# Patient Record
Sex: Male | Born: 1960 | Hispanic: Yes | Marital: Married | Smoking: Former smoker
Health system: Southern US, Community
[De-identification: ages and names within clinical notes are randomized; demographics above are authoritative.]

## PROBLEM LIST (undated history)

## (undated) DIAGNOSIS — E119 Type 2 diabetes mellitus without complications: Secondary | ICD-10-CM

## (undated) HISTORY — DX: Type 2 diabetes mellitus without complications: E11.9

---

## 2001-04-02 ENCOUNTER — Ambulatory Visit (HOSPITAL_COMMUNITY): Admission: RE | Admit: 2001-04-02 | Discharge: 2001-04-02 | Payer: Self-pay | Admitting: Pediatrics

## 2001-04-02 ENCOUNTER — Encounter: Payer: Self-pay | Admitting: General Surgery

## 2008-07-01 DIAGNOSIS — E119 Type 2 diabetes mellitus without complications: Secondary | ICD-10-CM | POA: Insufficient documentation

## 2014-06-13 ENCOUNTER — Other Ambulatory Visit (HOSPITAL_COMMUNITY): Payer: Self-pay | Admitting: General Surgery

## 2014-06-13 ENCOUNTER — Ambulatory Visit (HOSPITAL_COMMUNITY)
Admission: RE | Admit: 2014-06-13 | Discharge: 2014-06-13 | Disposition: A | Discharge status: Home / Self Care | Payer: Self-pay | Source: Ambulatory Visit | Attending: General Surgery | Admitting: General Surgery

## 2014-06-13 DIAGNOSIS — M25521 Pain in right elbow: Secondary | ICD-10-CM | POA: Insufficient documentation

## 2015-12-14 IMAGING — CR DG ELBOW COMPLETE 3+V*R*
4 series · 4 of 4 positions shown · non-contrast
Comparison: None

CLINICAL DATA: Six day month history of right elbow pain without
recent injury, but there is a history of remote injury

EXAM:
RIGHT ELBOW - COMPLETE 3+ VIEW

[view not recorded (1 of 4)]
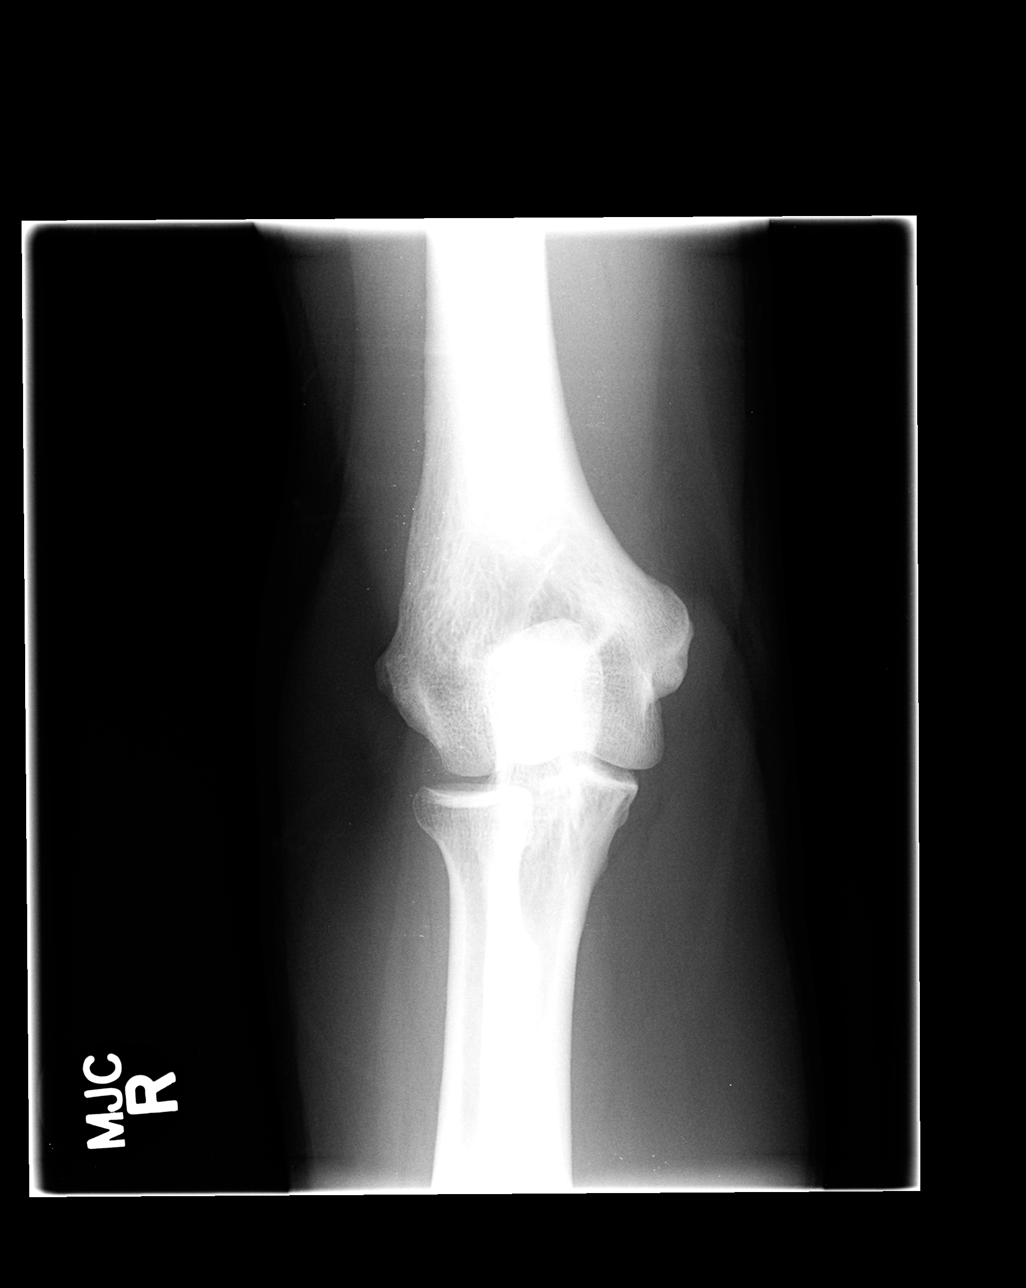

[view not recorded (2 of 4)]
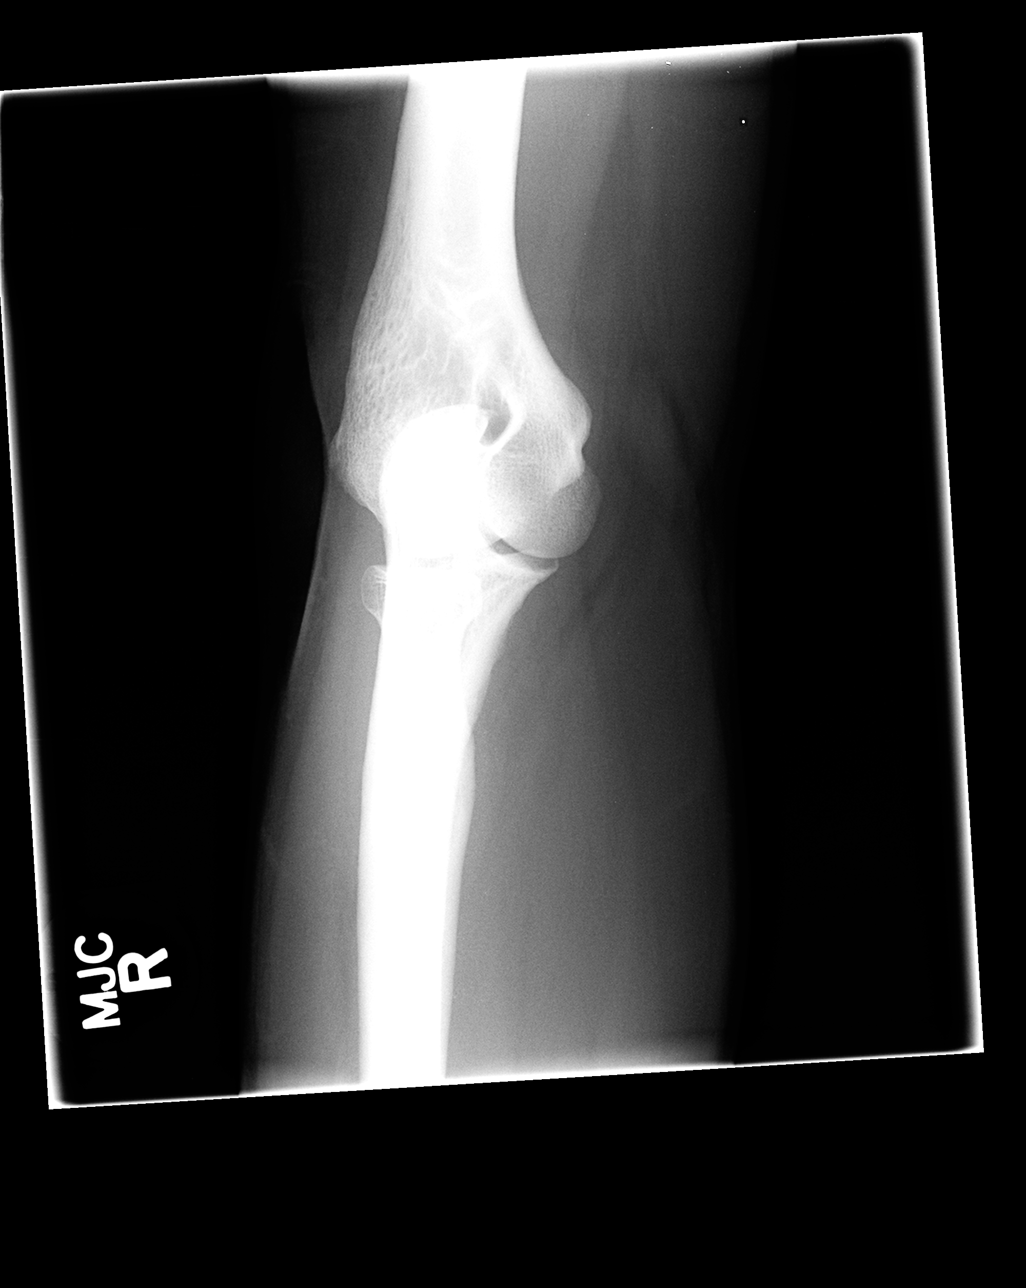

[view not recorded (3 of 4)]
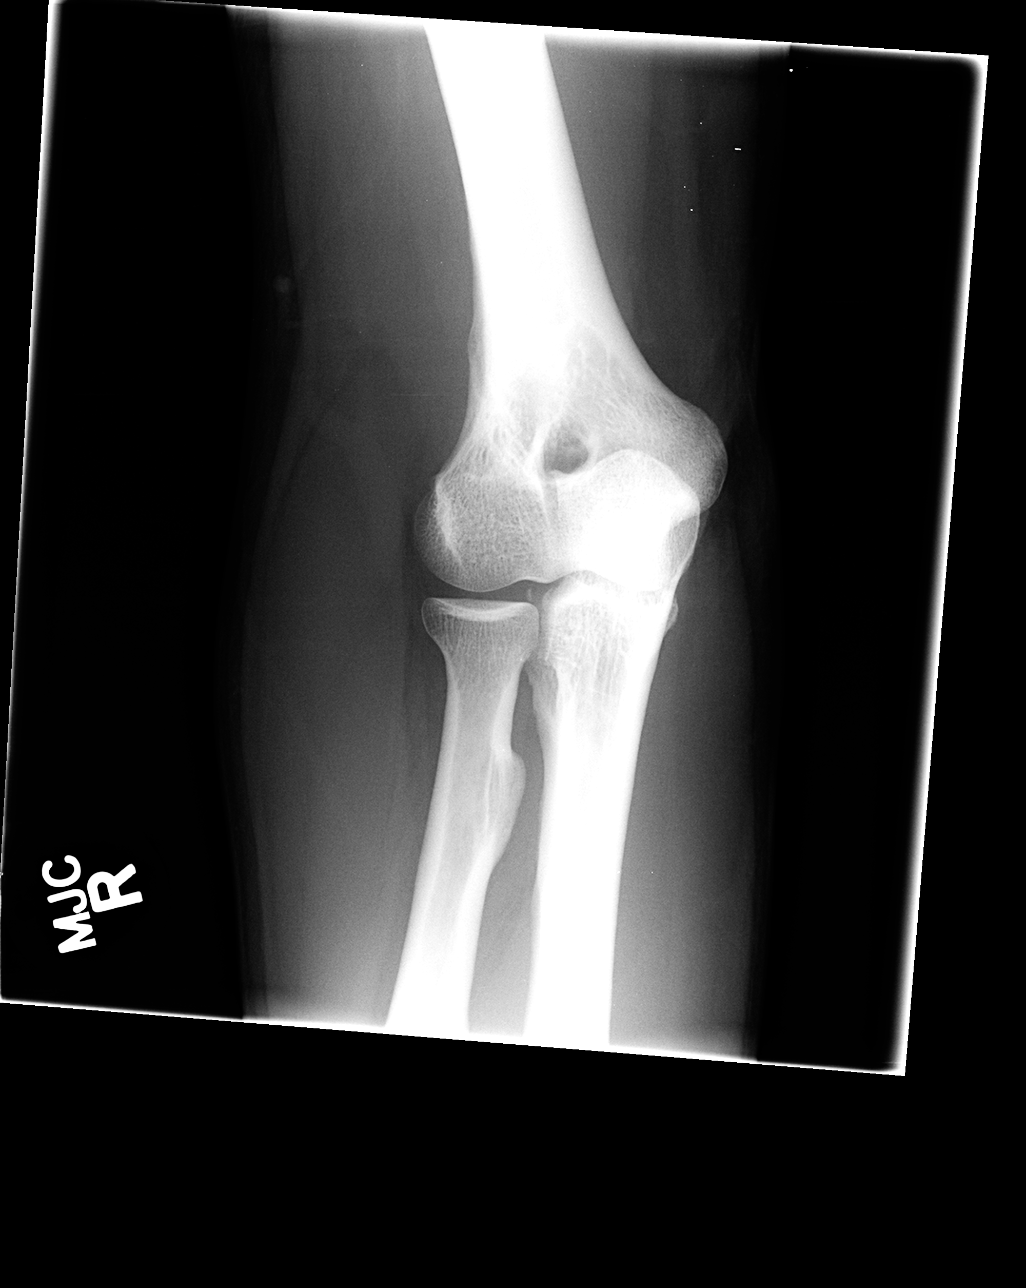

[view not recorded (4 of 4)]
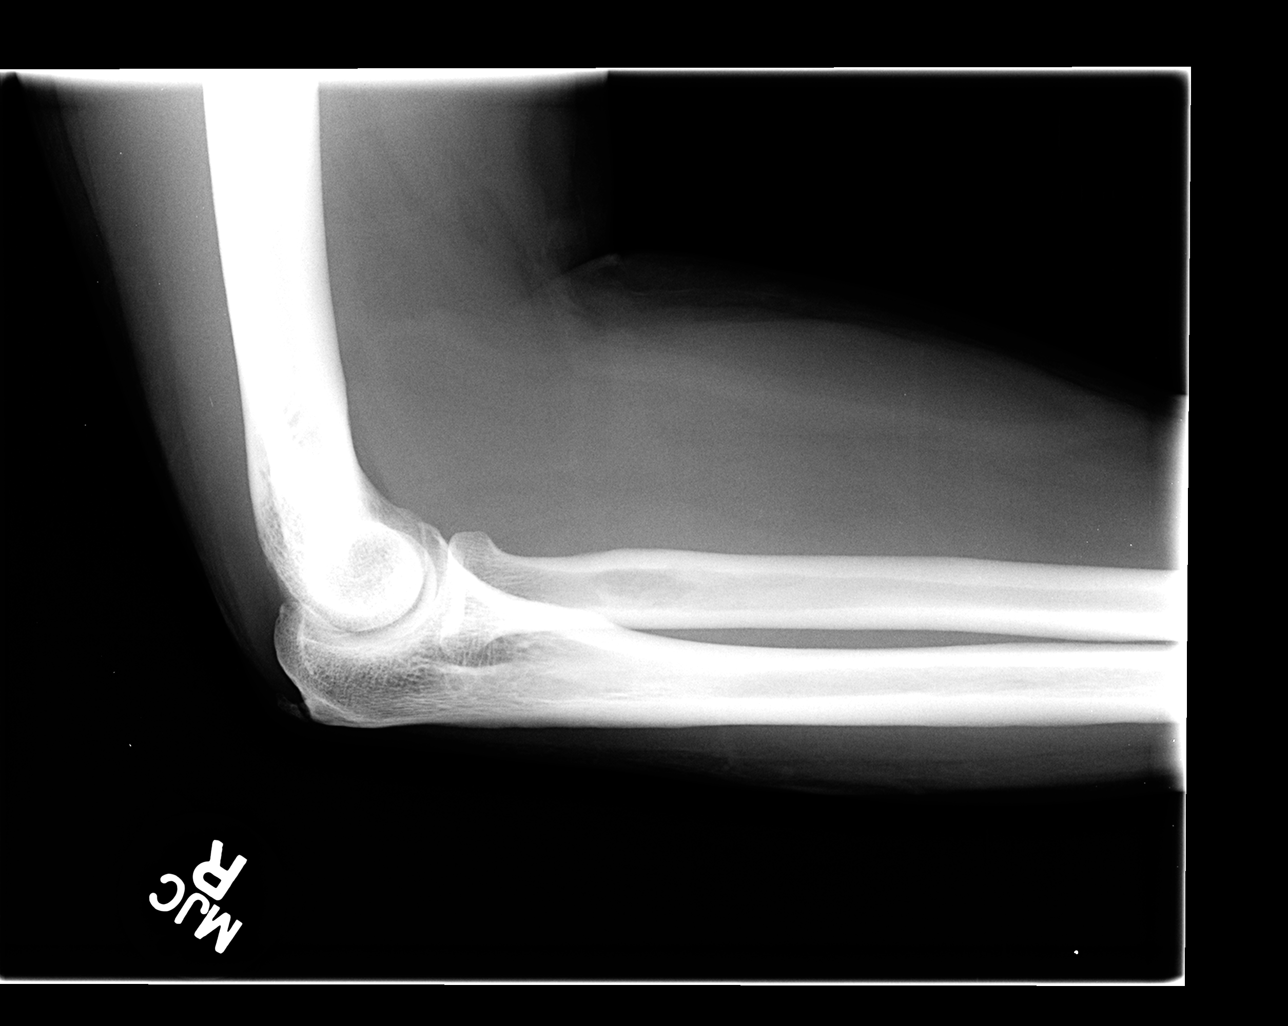

[4 of 4 positions shown; findings below may reference images not displayed]

FINDINGS: The bones of the elbow are adequately mineralized. There is no acute
or healing or old fracture. There is an olecranon spur. There is no
joint effusion.
IMPRESSION: There is no acute or significant chronic bony abnormality. There is
an olecranon spur which may be symptomatic in the appropriate
clinical setting.

## 2016-10-16 DIAGNOSIS — E785 Hyperlipidemia, unspecified: Secondary | ICD-10-CM | POA: Insufficient documentation

## 2017-01-15 DIAGNOSIS — B351 Tinea unguium: Secondary | ICD-10-CM | POA: Insufficient documentation

## 2017-03-08 DIAGNOSIS — Z720 Tobacco use: Secondary | ICD-10-CM | POA: Insufficient documentation

## 2019-03-09 ENCOUNTER — Other Ambulatory Visit: Payer: Self-pay

## 2019-03-09 DIAGNOSIS — Z20822 Contact with and (suspected) exposure to covid-19: Secondary | ICD-10-CM

## 2019-03-11 LAB — NOVEL CORONAVIRUS, NAA: SARS-CoV-2, NAA: DETECTED — AB

## 2019-04-12 ENCOUNTER — Other Ambulatory Visit: Payer: Self-pay

## 2019-04-12 DIAGNOSIS — Z20822 Contact with and (suspected) exposure to covid-19: Secondary | ICD-10-CM

## 2019-04-13 LAB — NOVEL CORONAVIRUS, NAA: SARS-CoV-2, NAA: NOT DETECTED

## 2019-08-26 ENCOUNTER — Ambulatory Visit: Payer: Self-pay | Admitting: Physician Assistant

## 2019-09-06 ENCOUNTER — Ambulatory Visit: Payer: Self-pay | Admitting: Physician Assistant

## 2019-09-11 ENCOUNTER — Other Ambulatory Visit: Payer: Self-pay

## 2019-09-11 ENCOUNTER — Ambulatory Visit: Payer: Self-pay | Attending: Internal Medicine

## 2019-09-11 DIAGNOSIS — Z23 Encounter for immunization: Secondary | ICD-10-CM

## 2019-09-11 NOTE — Progress Notes (Signed)
   Covid-19 Vaccination Clinic  Name:  Russell Greene    MRN: [id number] DOB: [birth year]  09/11/2019  Russell Greene was observed post Covid-19 immunization for 15 minutes without incident. He was provided with Vaccine Information Sheet and instruction to access the V-Safe system.   Russell Greene was instructed to call 911 with any severe reactions post vaccine: Marland Kitchen Difficulty breathing  . Swelling of face and throat  . A fast heartbeat  . A bad rash all over body  . Dizziness and weakness   Immunizations Administered    Name Date Dose VIS Date Route   Moderna COVID-19 Vaccine 09/11/2019  1:13 PM 0.5 mL 06/01/2019 Intramuscular   Manufacturer: Moderna   Lot: [id number]   NDC: 75643-329-51

## 2019-09-21 ENCOUNTER — Other Ambulatory Visit: Payer: Self-pay

## 2019-09-21 ENCOUNTER — Encounter: Payer: Self-pay | Admitting: Physician Assistant

## 2019-09-21 ENCOUNTER — Ambulatory Visit: Payer: Self-pay | Admitting: Physician Assistant

## 2019-09-21 ENCOUNTER — Ambulatory Visit: Payer: Self-pay | Attending: Internal Medicine

## 2019-09-21 DIAGNOSIS — Z20822 Contact with and (suspected) exposure to covid-19: Secondary | ICD-10-CM | POA: Insufficient documentation

## 2019-09-21 DIAGNOSIS — R05 Cough: Secondary | ICD-10-CM

## 2019-09-21 DIAGNOSIS — Z7689 Persons encountering health services in other specified circumstances: Secondary | ICD-10-CM

## 2019-09-21 DIAGNOSIS — R059 Cough, unspecified: Secondary | ICD-10-CM

## 2019-09-21 DIAGNOSIS — F17200 Nicotine dependence, unspecified, uncomplicated: Secondary | ICD-10-CM

## 2019-09-21 NOTE — Progress Notes (Signed)
There were no vitals taken for this visit.   Subjective:    Patient ID: Russell Greene, male    DOB: [birth year], 59 y.o.   MRN: [id number]  HPI: Russell Greene is a 59 y.o. male presenting on 09/21/2019 for New Patient (Initial Visit) (pt was seen with Dr. Markham Jordan for cough on wed. 09-15-19)   HPI Pt was scheduled for in-person appointment today but was changed to virtual appointment due to cough.   I connected with  Russell Greene on 09/21/19 by a video enabled telemedicine application and verified that I am speaking with the correct person using two identifiers.   I discussed the limitations of evaluation and management by telemedicine. The patient expressed understanding and agreed to proceed.  Pt is in his car in front of office.  Provider and nurse/translator are inside office.     Pt presents to office today to establish care.  Pt says he has had a cough for 2 weeks.    Pt had covid in September.   He got 1st dose of vaccination 09/11/19. And has appt for 2nd dose  Pt seen by 3 times dr Wende Crease.    Pt says he didn't test him for covid.  Pt says he did not get a cxr.  Pt says he got rx for cefdinir.  He says he got No inhaler.  Pt denies fever.   He denies wheezing.   Pt was seeing dr Janna Arch for his dm.    Pt works doing Building control surveyor.    Pt smokes.  Currently 2 or 3 daily.  Previously 1 ppd.    Pt says he occasionally checks his bs.  It was 290 about 3 days ago.  That was before eating.   pt is only using 12 units insulin only once daily.   He is taking it in the morning.    He is having No abdominal pain.     Relevant past medical, surgical, family and social history reviewed and updated as indicated. Interim medical history since our last visit reviewed. Allergies and medications reviewed and updated.   Current Outpatient Medications:  .  cefdinir (OMNICEF) 300 MG capsule, Take 300 mg by mouth 2 (two) times daily., Disp: , Rfl:  .  insulin NPH-regular Human  (70-30) 100 UNIT/ML injection, Inject 12 Units into the skin daily., Disp: , Rfl:  .  lisinopril (ZESTRIL) 5 MG tablet, Take 5 mg by mouth daily., Disp: , Rfl:    Review of Systems  Per HPI unless specifically indicated above     Objective:    There were no vitals taken for this visit.  Wt Readings from Last 3 Encounters:  No data found for Wt    Physical Exam Constitutional:      General: He is not in acute distress.    Appearance: He is not toxic-appearing.  HENT:     Head: Normocephalic and atraumatic.  Pulmonary:     Effort: No respiratory distress.     Comments: Dry cough noted Neurological:     Mental Status: He is alert and oriented to person, place, and time.  Psychiatric:        Attention and Perception: Attention normal.        Speech: Speech normal.        Behavior: Behavior is cooperative.             Assessment & Plan:   Encounter Diagnoses  Name Primary?  . Encounter to establish  care Yes  . Cough   . Suspected COVID-19 virus infection   . Tobacco use disorder      -pt is scheduled for covid test -If covid test is negative, will get labs and cxr.   -pt to Start checking fbs daiy.    He is to Take insulin bid.  Increase to 15units.   Pt counseled to  Call office for fbs < 70 or < 300. -pt will follow up in office in 2 weeks (in office if covid test negative)

## 2019-09-22 ENCOUNTER — Telehealth: Payer: Self-pay | Admitting: Student

## 2019-09-22 ENCOUNTER — Other Ambulatory Visit: Payer: Self-pay | Admitting: Physician Assistant

## 2019-09-22 DIAGNOSIS — R05 Cough: Secondary | ICD-10-CM

## 2019-09-22 DIAGNOSIS — E119 Type 2 diabetes mellitus without complications: Secondary | ICD-10-CM

## 2019-09-22 DIAGNOSIS — R059 Cough, unspecified: Secondary | ICD-10-CM

## 2019-09-22 LAB — NOVEL CORONAVIRUS, NAA: SARS-CoV-2, NAA: NOT DETECTED

## 2019-09-22 LAB — SARS-COV-2, NAA 2 DAY TAT

## 2019-09-22 NOTE — Telephone Encounter (Signed)
LPN called pt to notify him on negative COVID test. Pt verbalized understanding.  CXR has been ordered for pt to go to [facility] and fasting lab for pt to get done at [facility]. Pt was notified and Pt verbalized understanding and states he will go on Monday, 09-27-19.  Pt is to follow up for in person visit on 10-06-19. Pt verbalized understanding.

## 2019-09-29 ENCOUNTER — Other Ambulatory Visit: Payer: Self-pay

## 2019-09-29 ENCOUNTER — Ambulatory Visit (HOSPITAL_COMMUNITY)
Admission: RE | Admit: 2019-09-29 | Discharge: 2019-09-29 | Disposition: A | Discharge status: Home / Self Care | Payer: Self-pay | Source: Ambulatory Visit | Attending: Physician Assistant | Admitting: Physician Assistant

## 2019-09-29 DIAGNOSIS — R05 Cough: Secondary | ICD-10-CM | POA: Insufficient documentation

## 2019-10-06 ENCOUNTER — Ambulatory Visit: Payer: Self-pay | Admitting: Physician Assistant

## 2019-10-06 ENCOUNTER — Encounter: Payer: Self-pay | Admitting: Physician Assistant

## 2019-10-06 VITALS — BP 112/76 | HR 70 | Temp 97.7°F

## 2019-10-06 DIAGNOSIS — E1165 Type 2 diabetes mellitus with hyperglycemia: Secondary | ICD-10-CM

## 2019-10-06 DIAGNOSIS — F17200 Nicotine dependence, unspecified, uncomplicated: Secondary | ICD-10-CM

## 2019-10-06 MED ORDER — LISINOPRIL 5 MG PO TABS: 5.0000 mg | ORAL_TABLET | Freq: Every day | ORAL | 4 refills | Status: DC

## 2019-10-06 MED ORDER — INSULIN NPH ISOPHANE & REGULAR (70-30) 100 UNIT/ML ~~LOC~~ SUSP: 15.0000 [IU] | Freq: Two times a day (BID) | SUBCUTANEOUS | 99 refills | Status: DC

## 2019-10-06 NOTE — Progress Notes (Signed)
   BP 112/76   Pulse 70   Temp 97.7 F (36.5 C)   SpO2 98%    Subjective:    Patient ID: Russell Greene, male    DOB: 1960-10-25, 59 y.o.   MRN: [id number]  HPI: Russell Greene is a 59 y.o. male presenting on 10/06/2019 for No chief complaint on file.   HPI    Pt had a negative covid 19 screening questionnaire.    Pt is 58yoM with DM who recently transferred care to [facility]  He says he is feeling well.  He has bs log which reveals numbers high- mostly in the 200s.  Pt says he learned that sodas make his bs go high.  He has no complaints today.   He has still not gotten labs drawn that were ordered at new pt appointment last month.    Relevant past medical, surgical, family and social history reviewed and updated as indicated. Interim medical history since our last visit reviewed. Allergies and medications reviewed and updated.  CURRENT MEDS: novolin N 15u bid Lisinopril 5mg      Review of Systems  Per HPI unless specifically indicated above     Objective:    BP 112/76   Pulse 70   Temp 97.7 F (36.5 C)   SpO2 98%   Wt Readings from Last 3 Encounters:  No data found for Wt    Physical Exam Vitals reviewed.  Constitutional:      General: He is not in acute distress.    Appearance: Normal appearance. He is well-developed. He is not ill-appearing.  HENT:     Head: Normocephalic and atraumatic.  Cardiovascular:     Rate and Rhythm: Normal rate and regular rhythm.  Pulmonary:     Effort: Pulmonary effort is normal.     Breath sounds: Normal breath sounds. No wheezing.  Abdominal:     General: Bowel sounds are normal.     Palpations: Abdomen is soft.     Tenderness: There is no abdominal tenderness.  Musculoskeletal:     Cervical back: Neck supple.     Right lower leg: No edema.     Left lower leg: No edema.  Lymphadenopathy:     Cervical: No cervical adenopathy.  Skin:    General: Skin is warm and dry.  Neurological:     Mental Status: He is alert  and oriented to person, place, and time.  Psychiatric:        Attention and Perception: Attention normal.        Mood and Affect: Mood normal.        Speech: Speech normal.        Behavior: Behavior normal. Behavior is cooperative.           Assessment & Plan:     Encounter Diagnoses  Name Primary?  Uncontrolled type 2 diabetes mellitus with hyperglycemia (HCC) Yes  . Tobacco use disorder      -Pt to use novolin 70/30 (and not novolin N).  He is given rx.  Counseled to watch diabetic diet.    Will follow up 1 month to recheck sugars -he will continue lisinopril -pt to get labs drawn prior to next appointment -

## 2019-10-06 NOTE — Patient Instructions (Signed)
Venas varicosas Varicose Veins Las venas varicosas son venas que se han agrandado y abultado, y se han tornado sinuosas. Suelen aparecer en las piernas. Cules son las causas? La causa de esta afeccin es el dao en las vlvulas de la vena. Estas vlvulas ayudan a que la sangre regrese al corazn. Cuando estn daadas y dejan de funcionar correctamente, la sangre puede ir en direccin inversa y retornar a las venas cerca de la piel, lo que hace que las venas se agranden y se vean sinuosas. La afeccin puede surgir por cualquier factor que haga que la sangre retorne, como un embarazo, una actividad que obligue a estar de pie de forma prolongada o la obesidad. Qu incrementa el riesgo? Es ms probable que esta afeccin se manifieste en las personas con estas caractersticas:  Permanecen de pie mucho tiempo.  Estn embarazadas.  Tienen sobrepeso. Cules son los signos o los sntomas? Los sntomas de esta afeccin incluyen:  Venas abultadas, sinuosas y azuladas.  Sensacin de pesadez. Estos sntomas pueden empeorar hacia el final del da.  Dolor en las piernas. Estos sntomas pueden empeorar hacia el final del da.  Hinchazn en la pierna.  Cambios en el color de la piel que est sobre las venas. Cmo se diagnostica? Esta afeccin se puede diagnosticar en funcin de los sntomas, un examen fsico y una ecografa. Cmo se trata? El tratamiento de esta afeccin puede incluir lo siguiente:  Evitar estar sentado o de pie en la misma posicin durante mucho tiempo.  Usar medias de compresin. Estas medias ayudan a evitar la formacin de cogulos de sangre y a reducir la hinchazn de las piernas.  Levantar (elevar) las piernas al descansar.  Bajar de peso.  Hacer actividad fsica con regularidad. Si tiene sntomas persistentes o desea mejorar el aspecto de las venas varicosas, puede optar por un procedimiento para anular dichas venas o extraerlas. Entre los tratamientos para anular  las venas, se incluyen los siguientes:  Escleroterapia. En este tratamiento, se inyecta una solucin en la vena para anularla.  Tratamiento con lser. En este tratamiento, la vena se calienta con un lser para anularla.  Ablacin venosa por radiofrecuencia. En este tratamiento, se usa una corriente elctrica que se produce mediante ondas de radio para anular la vena. Entre los tratamientos para extraer las venas, se incluyen los siguientes:  Flebectoma. En este tratamiento, las venas se extraen a travs de pequeas incisiones que se realizan por encima de las venas.  Ligadura venosa y varicectoma. En este tratamiento, se realizan incisiones por encima de las venas. Luego, las venas se extraen despus de atarse (ligarse) con puntos (suturas). Siga estas instrucciones en su casa: Actividad  Camine todo lo que pueda. Caminar aumenta el flujo sanguneo. Esto ayuda a que la sangre regrese al corazn y alivia la presin en las venas. Tambin aumenta la fuerza cardiovascular.  Siga las instrucciones del mdico con respecto al ejercicio.  No permanezca de pie o sentado en una misma posicin durante mucho tiempo.  No se siente con las piernas cruzadas.  Descanse con las piernas levantadas durante el da. Instrucciones generales   Siga las instrucciones del mdico en lo que respecta a la dieta.  Use medias de compresin como se lo haya indicado su mdico. No use otra clase de vestimenta ajustada alrededor de las piernas, la pelvis o la cintura.  De noche, eleve las piernas a una altura superior al nivel del corazn.  Si se corta en la piel que est por encima de   una vena varicosa y esta sangra: ? Recustese con la pierna levantada. ? Coloque un pao limpio sobre el corte y presione firmemente sobre l hasta que el sangrado se detenga. ? Aplique una venda (vendaje) sobre el corte. Comunquese con un mdico si:  La piel alrededor de las venas varicosas empieza a agrietarse.  Siente  dolor o tiene enrojecimiento, sensibilidad o hinchazn dura sobre la vena.  Est incmodo debido al dolor.  Se corta en la piel de encima de una vena varicosa y no deja de sangrar. Resumen  Las venas varicosas son venas que se han agrandado y abultado, y se han tornado sinuosas. Suelen aparecer en las piernas.  La causa de esta afeccin es el dao en las vlvulas de la vena. Estas vlvulas ayudan a que la sangre regrese al corazn.  El tratamiento de esta afeccin incluye hacer movimientos frecuentes, usar medias de compresin, perder peso y hacer ejercicios de forma regular. En algunos casos, se realizan procedimientos que anulan o extraen las venas.  El tratamiento de esta afeccin puede incluir usar medias de compresin, elevar las piernas, perder peso y realizar actividades de forma regular. En algunos casos, se realizan procedimientos que anulan o extraen las venas. Esta informacin no tiene como fin reemplazar el consejo del mdico. Asegrese de hacerle al mdico cualquier pregunta que tenga. Document Revised: 09/09/2018 Document Reviewed: 09/09/2018 Elsevier Patient Education  2020 Elsevier Inc.  

## 2019-10-08 ENCOUNTER — Other Ambulatory Visit (HOSPITAL_COMMUNITY)
Admission: RE | Admit: 2019-10-08 | Discharge: 2019-10-08 | Disposition: A | Discharge status: Home / Self Care | Payer: Self-pay | Source: Ambulatory Visit | Attending: Physician Assistant | Admitting: Physician Assistant

## 2019-10-08 DIAGNOSIS — R05 Cough: Secondary | ICD-10-CM | POA: Insufficient documentation

## 2019-10-08 DIAGNOSIS — E119 Type 2 diabetes mellitus without complications: Secondary | ICD-10-CM | POA: Insufficient documentation

## 2019-10-08 DIAGNOSIS — R059 Cough, unspecified: Secondary | ICD-10-CM

## 2019-10-08 LAB — CBC WITH DIFFERENTIAL/PLATELET
Abs Immature Granulocytes: 0.01 10*3/uL (ref 0.00–0.07)
Basophils Absolute: 0 10*3/uL (ref 0.0–0.1)
Basophils Relative: 0 %
Eosinophils Absolute: 0.2 10*3/uL (ref 0.0–0.5)
Eosinophils Relative: 3 %
HCT: 48 % (ref 39.0–52.0)
Hemoglobin: 16.4 g/dL (ref 13.0–17.0)
Immature Granulocytes: 0 %
Lymphocytes Relative: 27 %
Lymphs Abs: 1.6 10*3/uL (ref 0.7–4.0)
MCH: 30 pg (ref 26.0–34.0)
MCHC: 34.2 g/dL (ref 30.0–36.0)
MCV: 87.9 fL (ref 80.0–100.0)
Monocytes Absolute: 0.7 10*3/uL (ref 0.1–1.0)
Monocytes Relative: 11 %
Neutro Abs: 3.5 10*3/uL (ref 1.7–7.7)
Neutrophils Relative %: 59 %
Platelets: 258 10*3/uL (ref 150–400)
RBC: 5.46 MIL/uL (ref 4.22–5.81)
RDW: 12.4 % (ref 11.5–15.5)
WBC: 6 10*3/uL (ref 4.0–10.5)
nRBC: 0 % (ref 0.0–0.2)

## 2019-10-08 LAB — COMPREHENSIVE METABOLIC PANEL
ALT: 28 U/L (ref 0–44)
AST: 23 U/L (ref 15–41)
Albumin: 3.9 g/dL (ref 3.5–5.0)
Alkaline Phosphatase: 68 U/L (ref 38–126)
Anion gap: 8 (ref 5–15)
BUN: 19 mg/dL (ref 6–20)
CO2: 22 mmol/L (ref 22–32)
Calcium: 8.6 mg/dL — ABNORMAL LOW (ref 8.9–10.3)
Chloride: 106 mmol/L (ref 98–111)
Creatinine, Ser: 0.76 mg/dL (ref 0.61–1.24)
GFR calc Af Amer: >60 mL/min (ref >60)
GFR calc non Af Amer: >60 mL/min (ref >60)
Glucose, Bld: 169 mg/dL — ABNORMAL HIGH (ref 70–99)
Potassium: 4.3 mmol/L (ref 3.5–5.1)
Sodium: 136 mmol/L (ref 135–145)
Total Bilirubin: 0.8 mg/dL (ref 0.3–1.2)
Total Protein: 6.5 g/dL (ref 6.5–8.1)

## 2019-10-08 LAB — LIPID PANEL
Cholesterol: 169 mg/dL (ref 0–200)
HDL: 30 mg/dL — ABNORMAL LOW (ref >40)
LDL Cholesterol: 103 mg/dL — ABNORMAL HIGH (ref 0–99)
Total CHOL/HDL Ratio: 5.6 RATIO
Triglycerides: 179 mg/dL — ABNORMAL HIGH (ref <150)
VLDL: 36 mg/dL (ref 0–40)

## 2019-10-09 LAB — MICROALBUMIN, URINE: Microalb, Ur: 11.6 ug/mL — ABNORMAL HIGH

## 2019-10-11 LAB — HEMOGLOBIN A1C
Hgb A1c MFr Bld: 9.3 % — ABNORMAL HIGH (ref 4.8–5.6)
Mean Plasma Glucose: 220 mg/dL

## 2019-10-13 ENCOUNTER — Ambulatory Visit: Payer: Self-pay | Attending: Internal Medicine

## 2019-10-13 DIAGNOSIS — Z23 Encounter for immunization: Secondary | ICD-10-CM

## 2019-10-13 NOTE — Progress Notes (Signed)
   Covid-19 Vaccination Clinic  Name:  Russell Greene    MRN: [id number] DOB: August 05, 1960  10/13/2019  [name] was observed post Covid-19 immunization for 15 minutes without incident. He was provided with Vaccine Information Sheet and instruction to access the V-Safe system.   [name] was instructed to call 911 with any severe reactions post vaccine: Marland Kitchen Difficulty breathing  . Swelling of face and throat  . A fast heartbeat  . A bad rash all over body  . Dizziness and weakness   Immunizations Administered    Name Date Dose VIS Date Route   Moderna COVID-19 Vaccine 10/13/2019 12:05 PM 0.5 mL 06/01/2019 Intramuscular   Manufacturer: Moderna   Lot: [id number]   NDC: 82574-935-52

## 2019-11-02 ENCOUNTER — Ambulatory Visit: Payer: Self-pay | Admitting: Physician Assistant

## 2019-11-02 ENCOUNTER — Encounter: Payer: Self-pay | Admitting: Physician Assistant

## 2019-11-02 DIAGNOSIS — E1165 Type 2 diabetes mellitus with hyperglycemia: Secondary | ICD-10-CM

## 2019-11-02 MED ORDER — ATORVASTATIN CALCIUM 20 MG PO TABS: 20.0000 mg | ORAL_TABLET | Freq: Every day | ORAL | 4 refills | Status: DC

## 2019-11-02 MED ORDER — METFORMIN HCL 500 MG PO TABS: 500.0000 mg | ORAL_TABLET | Freq: Two times a day (BID) | ORAL | 3 refills | Status: DC

## 2019-11-02 NOTE — Progress Notes (Signed)
There were no vitals taken for this visit.   Subjective:    Patient ID: Russell Greene, male    DOB: [birth year], 59 y.o.   MRN: [id number]  HPI: Russell Greene is a 59 y.o. male presenting on 11/02/2019 for No chief complaint on file.   HPI  This is a telemedicine appointment through [employer] due to coronavirus pandemic.    I connected with  Rolland Porter on 11/02/19 by a video enabled telemedicine application and verified that I am speaking with the correct person using two identifiers.   I discussed the limitations of evaluation and management by telemedicine. The patient expressed understanding and agreed to proceed.  Pt is at home.  Provider is at office.    Pt is 58yoM with DM who recently established care at [facility].   He was changed to 70/30 insulin and has been monitoring his bs.  fbs running 143 117 154 170 176 133 254 154 160 136  120   He stopped drinking sodas and says that has helped his sugars.    He says he was on pills in the past.  He says they didn't help him much but he says he was eating poorly at the time.    Pt says he is feeling well and he has no complaints today.    Relevant past medical, surgical, family and social history reviewed and updated as indicated. Interim medical history since our last visit reviewed. Allergies and medications reviewed and updated.    Current Outpatient Medications:  .  insulin NPH-regular Human (70-30) 100 UNIT/ML injection, Inject 15 Units into the skin 2 (two) times daily with a meal., Disp: 10 mL, Rfl: prn .  lisinopril (ZESTRIL) 5 MG tablet, Take 1 tablet (5 mg total) by mouth daily., Disp: 30 tablet, Rfl: 4    Review of Systems  Per HPI unless specifically indicated above     Objective:    There were no vitals taken for this visit.  Wt Readings from Last 3 Encounters:  No data found for Wt    Physical Exam Constitutional:      General: He is not in acute distress.    Appearance: He is not  toxic-appearing.  HENT:     Head: Normocephalic and atraumatic.  Pulmonary:     Effort: No respiratory distress.  Neurological:     Mental Status: He is alert and oriented to person, place, and time.  Psychiatric:        Attention and Perception: Attention normal.        Speech: Speech normal.        Behavior: Behavior is cooperative.     Results for orders placed or performed during the hospital encounter of 10/08/19  CBC w/Diff/Platelet  Result Value Ref Range   WBC 6.0 4.0 - 10.5 K/uL   RBC 5.46 4.22 - 5.81 MIL/uL   Hemoglobin 16.4 13.0 - 17.0 g/dL   HCT 12/08/19 50.0 - 93.8 %   MCV 87.9 80.0 - 100.0 fL   MCH 30.0 26.0 - 34.0 pg   MCHC 34.2 30.0 - 36.0 g/dL   RDW 18.2 99.3 - 71.6 %   Platelets 258 150 - 400 K/uL   nRBC 0.0 0.0 - 0.2 %   Neutrophils Relative % 59 %   Neutro Abs 3.5 1.7 - 7.7 K/uL   Lymphocytes Relative 27 %   Lymphs Abs 1.6 0.7 - 4.0 K/uL   Monocytes Relative 11 %   Monocytes Absolute 0.7  0.1 - 1.0 K/uL   Eosinophils Relative 3 %   Eosinophils Absolute 0.2 0.0 - 0.5 K/uL   Basophils Relative 0 %   Basophils Absolute 0.0 0.0 - 0.1 K/uL   Immature Granulocytes 0 %   Abs Immature Granulocytes 0.01 0.00 - 0.07 K/uL  Microalbumin, urine  Result Value Ref Range   Microalb, Ur 11.6 (H) Not Estab. ug/mL  Comprehensive metabolic panel  Result Value Ref Range   Sodium 136 135 - 145 mmol/L   Potassium 4.3 3.5 - 5.1 mmol/L   Chloride 106 98 - 111 mmol/L   CO2 22 22 - 32 mmol/L   Glucose, Bld 169 (H) 70 - 99 mg/dL   BUN 19 6 - 20 mg/dL   Creatinine, Ser 0.76 0.61 - 1.24 mg/dL   Calcium 8.6 (L) 8.9 - 10.3 mg/dL   Total Protein 6.5 6.5 - 8.1 g/dL   Albumin 3.9 3.5 - 5.0 g/dL   AST 23 15 - 41 U/L   ALT 28 0 - 44 U/L   Alkaline Phosphatase 68 38 - 126 U/L   Total Bilirubin 0.8 0.3 - 1.2 mg/dL   GFR calc non Af Amer >60 >60 mL/min   GFR calc Af Amer >60 >60 mL/min   Anion gap 8 5 - 15  Lipid panel  Result Value Ref Range   Cholesterol 169 0 - 200 mg/dL    Triglycerides 179 (H) <150 mg/dL   HDL 30 (L) >40 mg/dL   Total CHOL/HDL Ratio 5.6 RATIO   VLDL 36 0 - 40 mg/dL   LDL Cholesterol 103 (H) 0 - 99 mg/dL  Hemoglobin A1c  Result Value Ref Range   Hgb A1c MFr Bld 9.3 (H) 4.8 - 5.6 %   Mean Plasma Glucose 220 mg/dL      Assessment & Plan:   Encounter Diagnosis  Name Primary?  Marland Kitchen Uncontrolled type 2 diabetes mellitus with hyperglycemia ([place]) Yes     -reviewed labs with pt  -will Add atorvastatin -will Add metformin -pt to Continue insulin and lisinopril -pt to continue to monitor bs.  He is reminded to contact office for fbs < 70 or > 300 -pt to follow up 1 month to review bs log.  He is to contact office sooner prn

## 2019-12-01 ENCOUNTER — Ambulatory Visit: Payer: Self-pay | Admitting: Physician Assistant

## 2019-12-01 ENCOUNTER — Encounter: Payer: Self-pay | Admitting: Physician Assistant

## 2019-12-01 DIAGNOSIS — E1165 Type 2 diabetes mellitus with hyperglycemia: Secondary | ICD-10-CM

## 2019-12-01 NOTE — Progress Notes (Signed)
   There were no vitals taken for this visit.   Subjective:    Patient ID: Russell Greene, male    DOB: May 20, 1961, 59 y.o.   MRN: [id number]  HPI: Russell Greene is a 59 y.o. male presenting on 12/01/2019 for Diabetes   HPI  This is a telemedicine appointment through [employer] due to coronavirus pandemic.  I connected with  Russell Greene on 12/01/19 by a video enabled telemedicine application and verified that I am speaking with the correct person using two identifiers.   I discussed the limitations of evaluation and management by telemedicine. The patient expressed understanding and agreed to proceed.  Pt is in his parked car in parking lot.  Provider is inside office.   Pt was scheduled for in person appointment today but was changed to virtual due to he just returned from [place] 3 days ago.   Pt did get both doses of covid vaccination and says he is feeling well at this time.    Pt is 59yoM with uncontrolled DM.  He is Using 15 u f 70/30 insulin bid.  He is monitoring his fbs and says bs running 120-150.  No lows.  He is watching what he eats.    He says he is doing well with his new meds.    Relevant past medical, surgical, family and social history reviewed and updated as indicated. Interim medical history since our last visit reviewed. Allergies and medications reviewed and updated.   Current Outpatient Medications:  .  atorvastatin (LIPITOR) 20 MG tablet, Take 1 tablet (20 mg total) by mouth daily. Tome una tableta por boca al dormir, Disp: 30 tablet, Rfl: 4 .  insulin NPH-regular Human (70-30) 100 UNIT/ML injection, Inject 15 Units into the skin 2 (two) times daily with a meal., Disp: 10 mL, Rfl: prn .  lisinopril (ZESTRIL) 5 MG tablet, Take 1 tablet (5 mg total) by mouth daily., Disp: 30 tablet, Rfl: 4 .  metFORMIN (GLUCOPHAGE) 500 MG tablet, Take 1 tablet (500 mg total) by mouth 2 (two) times daily with a meal. Tome una tableta por boca dos veces diarias con comida,  Disp: 60 tablet, Rfl: 3    Review of Systems  Per HPI unless specifically indicated above     Objective:    There were no vitals taken for this visit.  Wt Readings from Last 3 Encounters:  No data found for Wt    Physical Exam Constitutional:      General: He is not in acute distress. HENT:     Head: Normocephalic and atraumatic.  Pulmonary:     Effort: No respiratory distress.  Neurological:     Mental Status: He is alert and oriented to person, place, and time.  Psychiatric:        Attention and Perception: Attention normal.        Mood and Affect: Mood normal.        Speech: Speech normal.        Behavior: Behavior is cooperative.          Assessment & Plan:   Encounter Diagnosis  Name Primary?  Russell Greene Uncontrolled type 2 diabetes mellitus with hyperglycemia (HCC) Yes  \    -Pt to continue with current medications -pt to follow up in 6 weeks.  He is to contact office sooner prn

## 2020-01-17 ENCOUNTER — Ambulatory Visit: Payer: Self-pay | Admitting: Physician Assistant

## 2020-03-15 ENCOUNTER — Ambulatory Visit: Payer: Self-pay | Admitting: Physician Assistant

## 2020-09-19 DIAGNOSIS — Z87891 Personal history of nicotine dependence: Secondary | ICD-10-CM | POA: Insufficient documentation

## 2020-10-05 ENCOUNTER — Ambulatory Visit: Payer: Self-pay | Admitting: Podiatry

## 2020-10-10 ENCOUNTER — Ambulatory Visit: Payer: Self-pay | Admitting: Podiatry

## 2020-10-11 ENCOUNTER — Ambulatory Visit (INDEPENDENT_AMBULATORY_CARE_PROVIDER_SITE_OTHER): Payer: Self-pay | Admitting: Podiatry

## 2020-10-11 ENCOUNTER — Encounter: Payer: Self-pay | Admitting: Podiatry

## 2020-10-11 ENCOUNTER — Other Ambulatory Visit: Payer: Self-pay

## 2020-10-11 DIAGNOSIS — B351 Tinea unguium: Secondary | ICD-10-CM

## 2020-10-11 DIAGNOSIS — B353 Tinea pedis: Secondary | ICD-10-CM

## 2020-10-11 MED ORDER — TERBINAFINE HCL 250 MG PO TABS: 250.0000 mg | ORAL_TABLET | Freq: Every day | ORAL | 0 refills | Status: AC

## 2020-10-11 MED ORDER — KETOCONAZOLE 2 % EX CREA: 1.0000 "application " | TOPICAL_CREAM | Freq: Every day | CUTANEOUS | 2 refills | Status: DC

## 2020-10-16 NOTE — Progress Notes (Signed)
  Subjective:  Patient ID: Russell Greene, male    DOB: [birth year],  MRN: [id number]  Chief Complaint  Patient presents with  . Callouses  . Nail Problem  . Numbness    Patient presents today for referral from pcp for nail fungus, sore between right 3rd and 4th toe and numbness, tingling, burning bilat feet    60 y.o. male presents with the above complaint. History confirmed with patient.   Objective:  Physical Exam: warm, good capillary refill, no trophic changes or ulcerative lesions and normal DP and PT pulses.  Abnormal monofilament testing tips of toes.  Heloma molle third interspace right foot.  Onychomycosis with thickening of nails and dystrophy.  Dry scaling rash plantar foot and interdigitally      Assessment:   1. Onychomycosis   2. Tinea pedis of both feet      Plan:  Patient was evaluated and treated and all questions answered.  Patient educated on diabetes. Discussed proper diabetic foot care and discussed risks and complications of disease. Educated patient in depth on reasons to return to the office immediately should he/she discover anything concerning or new on the feet. All questions answered. Discussed proper shoes as well.   Discussed etiology and treatment options for onychomycosis.  Recommended terbinafine 90-day course.  This was sent to his pharmacy.  Photographs taken.  Follow-up in 4 months to reevaluate  Discussed the etiology and treatment options for tinea pedis.  Discussed topical and oral treatment.  Recommended topical treatment with 2% ketoconazole cream.  This was sent to the patient's pharmacy.  Also discussed appropriate foot hygiene, use of antifungal spray such as Tinactin in shoes, as well as cleaning foot surfaces such as showers and bathroom floors with bleach.   Return in about 4 months (around 02/10/2021) for recheck nail fungus and athletes foot.

## 2020-11-20 ENCOUNTER — Other Ambulatory Visit: Payer: Self-pay | Admitting: Physician Assistant

## 2021-02-14 ENCOUNTER — Encounter: Payer: Self-pay | Admitting: Podiatry

## 2021-04-01 ENCOUNTER — Emergency Department
Admission: EM | Admit: 2021-04-01 | Discharge: 2021-04-01 | Disposition: A | Discharge status: Home / Self Care | Payer: Self-pay | Attending: Emergency Medicine | Admitting: Emergency Medicine

## 2021-04-01 ENCOUNTER — Other Ambulatory Visit: Payer: Self-pay

## 2021-04-01 ENCOUNTER — Emergency Department: Payer: Self-pay

## 2021-04-01 DIAGNOSIS — W312XXA Contact with powered woodworking and forming machines, initial encounter: Secondary | ICD-10-CM | POA: Insufficient documentation

## 2021-04-01 DIAGNOSIS — E119 Type 2 diabetes mellitus without complications: Secondary | ICD-10-CM | POA: Insufficient documentation

## 2021-04-01 DIAGNOSIS — Z23 Encounter for immunization: Secondary | ICD-10-CM | POA: Insufficient documentation

## 2021-04-01 DIAGNOSIS — Z87891 Personal history of nicotine dependence: Secondary | ICD-10-CM | POA: Insufficient documentation

## 2021-04-01 DIAGNOSIS — Z794 Long term (current) use of insulin: Secondary | ICD-10-CM | POA: Insufficient documentation

## 2021-04-01 DIAGNOSIS — Z7984 Long term (current) use of oral hypoglycemic drugs: Secondary | ICD-10-CM | POA: Insufficient documentation

## 2021-04-01 DIAGNOSIS — Y9389 Activity, other specified: Secondary | ICD-10-CM | POA: Insufficient documentation

## 2021-04-01 DIAGNOSIS — S81012A Laceration without foreign body, left knee, initial encounter: Secondary | ICD-10-CM | POA: Insufficient documentation

## 2021-04-01 MED ORDER — OXYCODONE-ACETAMINOPHEN 5-325 MG PO TABS
2.0000 | ORAL_TABLET | Freq: Once | ORAL | Status: AC
  Administered 2021-04-01: 2 via ORAL
  Filled 2021-04-01: qty 2

## 2021-04-01 MED ORDER — OXYCODONE-ACETAMINOPHEN 5-325 MG PO TABS: 1.0000 | ORAL_TABLET | Freq: Four times a day (QID) | ORAL | 0 refills | Status: AC | PRN

## 2021-04-01 MED ORDER — ONDANSETRON 4 MG PO TBDP
4.0000 mg | ORAL_TABLET | Freq: Once | ORAL | Status: AC
  Administered 2021-04-01: 4 mg via ORAL
  Filled 2021-04-01: qty 1

## 2021-04-01 MED ORDER — TETANUS-DIPHTH-ACELL PERTUSSIS 5-2.5-18.5 LF-MCG/0.5 IM SUSY
0.5000 mL | PREFILLED_SYRINGE | Freq: Once | INTRAMUSCULAR | Status: AC
  Administered 2021-04-01: 0.5 mL via INTRAMUSCULAR
  Filled 2021-04-01: qty 0.5

## 2021-04-01 MED ORDER — CEPHALEXIN 500 MG PO CAPS: 500.0000 mg | ORAL_CAPSULE | Freq: Four times a day (QID) | ORAL | 0 refills | Status: AC

## 2021-04-01 MED ORDER — LIDOCAINE-EPINEPHRINE 2 %-1:100000 IJ SOLN
20.0000 mL | Freq: Once | INTRAMUSCULAR | Status: AC
  Administered 2021-04-01: 20 mL
  Filled 2021-04-01: qty 1

## 2021-04-01 NOTE — ED Triage Notes (Signed)
Pt comes with c/o laceration to left knee. Pt states he was on ladder cutting brush and accidentally cut his knee with chain saw.  Bleeding controlled at this time.

## 2021-04-01 NOTE — ED Provider Notes (Signed)
Emergency Medicine Provider Triage Evaluation Note  TORAN MURCH , a 60 y.o. male  was evaluated in triage.  Pt complains of laceration to left knee.  Review of Systems  Positive: Laceration to left knee and knee pain, difficulty with gait Negative: Knee swelling  Physical Exam  BP 119/89   Pulse 71   Temp 98 F (36.7 C)   Resp 18   SpO2 97%  Gen:   Awake, appears in pain but NAD Resp:  Normal effort MSK:   Decreased flexion of the left knee secondary to pain.  Normal extension. Skin:  Large laceration noted overlying the kneecap.  Able to visualize the kneecap when he moves.  Medical Decision Making  Medically screening exam initiated at 3:29 PM.  Appropriate orders placed.  JAFET WISSING was informed that the remainder of the evaluation will be completed by another provider, this initial triage assessment does not replace that evaluation, and the importance of remaining in the ED until their evaluation is complete.     Lorre Munroe, NP 04/01/21 1533    Gilles Chiquito, MD 04/01/21 [phone]

## 2021-04-01 NOTE — Discharge Instructions (Addendum)
Have external sutures removed in 14 days.  Please make follow up appointment with Dr. Joice Lofts.  Wear knee immobilizer to keep from stretching stitches.  Take Keflex 4 times a day for one week. Keep wound clean and dry for the next 48 hours.  After 48 hours, you can shower normally.  You can remove knee immobilizer for showering and sleep.

## 2021-04-01 NOTE — ED Provider Notes (Signed)
[facility]-EMERGENCY DEPARTMENT  ____________________________________________  Time seen: Approximately 10:51 PM  I have reviewed the triage vital signs and the nursing notes.   HISTORY  Chief Complaint Laceration   Historian Patient     HPI Russell Greene is a 60 y.o. male with a history of diabetes, presents to the emergency department with a 6 cm laceration overlying left knee deep to muscle.  Laceration was sustained accidentally with a chain saw.  Patient does not recall last tetanus shot.  Patient has been able to bear weight since injury occurred.   Past Medical History:  Diagnosis Date   Diabetes mellitus without complication (HCC)      Immunizations up to date:  Yes.     Past Medical History:  Diagnosis Date   Diabetes mellitus without complication [facility])     Patient Active Problem List   Diagnosis Date Noted   Former heavy tobacco smoker 09/19/2020   Tobacco user 03/08/2017   Onychomycosis 01/15/2017   Dyslipidemia 10/16/2016   Diabetes mellitus (HCC) 07/01/2008    History reviewed. No pertinent surgical history.  Prior to Admission medications   Medication Sig Start Date End Date Taking? Authorizing Provider  cephALEXin (KEFLEX) 500 MG capsule Take 1 capsule (500 mg total) by mouth 4 (four) times daily for 7 days. 04/01/21 04/08/21 Yes Pia Mau M, PA-C  oxyCODONE-acetaminophen (PERCOCET/ROXICET) 5-325 MG tablet Take 1 tablet by mouth every 6 (six) hours as needed for up to 3 days. 04/01/21 04/04/21 Yes Pia Mau M, PA-C  atorvastatin (LIPITOR) 20 MG tablet Take 1 tablet (20 mg total) by mouth daily. Tome una tableta por boca al dormir 11/02/19   Jacquelin Hawking, PA-C  dapagliflozin propanediol (FARXIGA) 10 MG TABS tablet Farxiga 10 mg tablet  Take 1 tablet every day by oral route for 21 days.    [provider]  insulin NPH-regular Human (70-30) 100 UNIT/ML injection Inject 15 Units into the skin 2 (two) times daily with a meal. 10/06/19    Jacquelin Hawking, PA-C  ketoconazole (NIZORAL) 2 % cream ketoconazole 2 % topical cream  APPLY CREAM TO THE AFFECTED AREA(S) ONCE DAILY FOR 6 WEEKS    [provider]  ketoconazole (NIZORAL) 2 % cream Apply 1 application topically daily. 10/11/20   McDonald, Rachelle Hora, DPM  lisinopril (ZESTRIL) 5 MG tablet Take 1 tablet (5 mg total) by mouth daily. 10/06/19   Jacquelin Hawking, PA-C  lovastatin (MEVACOR) 10 MG tablet lovastatin 10 mg tablet  Take 1 tablet every day by oral route at bedtime.    [provider]  metFORMIN (GLUCOPHAGE) 500 MG tablet Take 1 tablet (500 mg total) by mouth 2 (two) times daily with a meal. Tome una tableta por boca dos veces diarias con comida 11/02/19   Jacquelin Hawking, PA-C    Allergies Patient has no known allergies.  Family History  Problem Relation Age of Onset   Parkinson's disease Father     Social History Social History   Tobacco Use   Smoking status: Former    Packs/day: 0.25    Years: 20.00    Pack years: 5.00    Types: Cigarettes    Quit date: 09/30/2019    Years since quitting: 1.5   Smokeless tobacco: Never  Vaping Use   Vaping Use: Never used  Substance Use Topics   Drug use: Never     Review of Systems  Constitutional: No fever/chills Eyes:  No discharge ENT: No upper respiratory complaints. Respiratory: no cough. No SOB/ use  of accessory muscles to breath Gastrointestinal:   No nausea, no vomiting.  No diarrhea.  No constipation. Musculoskeletal: Negative for musculoskeletal pain. Skin: Patient has left knee laceration.    ____________________________________________   PHYSICAL EXAM:  VITAL SIGNS: ED Triage Vitals  Enc Vitals Group     BP 04/01/21 1527 119/89     Pulse Rate 04/01/21 1527 71     Resp 04/01/21 1527 18     Temp 04/01/21 1527 98 F (36.7 C)     Temp src --      SpO2 04/01/21 1527 97 %     Weight --      Height --      Head Circumference --      Peak Flow --      Pain Score 04/01/21 1525 5      Pain Loc --      Pain Edu? --      Excl. in GC? --      Constitutional: Alert and oriented. Well appearing and in no acute distress. Eyes: Conjunctivae are normal. PERRL. EOMI. Head: Atraumatic. ENT:  Cardiovascular: Normal rate, regular rhythm. Normal S1 and S2.  Good peripheral circulation. Respiratory: Normal respiratory effort without tachypnea or retractions. Lungs CTAB. Good air entry to the bases with no decreased or absent breath sounds Gastrointestinal: Bowel sounds x 4 quadrants. Soft and nontender to palpation. No guarding or rigidity. No distention. Musculoskeletal: Full range of motion to all extremities. No obvious deformities noted Neurologic:  Normal for age. No gross focal neurologic deficits are appreciated.  Skin: Patient has a 6 cm laceration deep to underlying muscle.  No debris in wound. Psychiatric: Mood and affect are normal for age. Speech and behavior are normal.   ____________________________________________   LABS (all labs ordered are listed, but only abnormal results are displayed)  Labs Reviewed - No data to display ____________________________________________  EKG   ____________________________________________  RADIOLOGY Geraldo Pitter, personally viewed and evaluated these images (plain radiographs) as part of my medical decision making, as well as reviewing the written report by the radiologist.  DG Knee Complete 4 Views Left  Result Date: 04/01/2021 CLINICAL DATA:  Left knee laceration from chainsaw injury. EXAM: LEFT KNEE - COMPLETE 4+ VIEW COMPARISON:  None. FINDINGS: No evidence of fracture, dislocation, or joint effusion. No evidence of arthropathy or other focal bone abnormality. Soft tissues are unremarkable. IMPRESSION: Negative. Electronically Signed   By: Elberta Fortis M.D.   On: 04/01/2021 16:07    ____________________________________________    PROCEDURES  Procedure(s) performed:     Marland KitchenMarland KitchenLaceration  Repair  Date/Time: 04/01/2021 10:53 PM Performed by: Orvil Feil, PA-C Authorized by: Orvil Feil, PA-C   Consent:    Consent obtained:  Verbal   Risks discussed:  Infection and pain Universal protocol:    Procedure explained and questions answered to patient or proxy's satisfaction: yes     Patient identity confirmed:  Verbally with patient Anesthesia:    Anesthesia method:  Local infiltration   Local anesthetic:  Lidocaine 1% WITH epi Laceration details:    Location:  Leg   Leg location:  L knee   Length (cm):  6   Depth (mm):  5 Pre-procedure details:    Preparation:  Patient was prepped and draped in usual sterile fashion Exploration:    Limited defect created (wound extended): yes     Contaminated: yes   Treatment:    Area cleansed with:  Povidone-iodine   Irrigation solution:  Sterile saline   Irrigation volume:  500   Debridement:  None   Layers/structures repaired:  Muscle belly Muscle belly:    Suture size:  4-0   Suture material:  Chromic gut   Suture technique:  Buried horizontal mattress   Number of sutures:  6 Skin repair:    Repair method:  Sutures   Suture size:  4-0   Suture technique:  Running locked   Number of sutures:  14 Approximation:    Approximation:  Close Post-procedure details:    Dressing:  Non-adherent dressing Comments:     His wound was repaired without complication and patient was placed in a knee immobilizer.     Medications  lidocaine-EPINEPHrine (XYLOCAINE W/EPI) 2 %-1:100000 (with pres) injection 20 mL (20 mLs Infiltration Given 04/01/21 1836)  Tdap (BOOSTRIX) injection 0.5 mL (0.5 mLs Intramuscular Given 04/01/21 1834)  oxyCODONE-acetaminophen (PERCOCET/ROXICET) 5-325 MG per tablet 2 tablet (2 tablets Oral Given 04/01/21 1832)  ondansetron (ZOFRAN-ODT) disintegrating tablet 4 mg (4 mg Oral Given 04/01/21 1833)     ____________________________________________   INITIAL IMPRESSION / ASSESSMENT AND PLAN / ED  COURSE  Pertinent labs & imaging results that were available during my care of the patient were reviewed by me and considered in my medical decision making (see chart for details).    Assessment and plan Knee laceration 60 year old male presents to the emergency department with acute left knee pain after a chainsaw injury.  Vital signs are reassuring at triage.  On physical exam, patient was able to bear weight and could flex and extend left knee easily.  No bony abnormality or retained foreign body was visualized on x-ray.  Patient's wound was copiously irrigated in the emergency department.  I cautioned patient there was a high risk of infection given mechanism of injury and history of diabetes.  Wound was repaired using sutures and patient was placed in a knee immobilizer.  Crutches were provided and Percocet was prescribed for pain.  He was discharged with Keflex and advised to follow-up with orthopedics.  Recommended having external sutures removed in 14 days.  Return precautions were given to return with new or worsening symptoms.      ____________________________________________  FINAL CLINICAL IMPRESSION(S) / ED DIAGNOSES  Final diagnoses:  Laceration of left knee, initial encounter      NEW MEDICATIONS STARTED DURING THIS VISIT:  ED Discharge Orders          Ordered    cephALEXin (KEFLEX) 500 MG capsule  4 times daily        04/01/21 1948    oxyCODONE-acetaminophen (PERCOCET/ROXICET) 5-325 MG tablet  Every 6 hours PRN        04/01/21 1951                This chart was dictated using voice recognition software/Dragon. Despite best efforts to proofread, errors can occur which can change the meaning. Any change was purely unintentional.     Orvil Feil, PA-C 04/01/21 2255    Shaune Pollack, MD 04/02/21 [phone]

## 2021-04-15 ENCOUNTER — Emergency Department
Admission: EM | Admit: 2021-04-15 | Discharge: 2021-04-15 | Disposition: A | Discharge status: Home / Self Care | Payer: Self-pay | Attending: Physician Assistant | Admitting: Physician Assistant

## 2021-04-15 ENCOUNTER — Other Ambulatory Visit: Payer: Self-pay

## 2021-04-15 ENCOUNTER — Encounter: Payer: Self-pay | Admitting: Physician Assistant

## 2021-04-15 DIAGNOSIS — W19XXXD Unspecified fall, subsequent encounter: Secondary | ICD-10-CM | POA: Insufficient documentation

## 2021-04-15 DIAGNOSIS — Z79899 Other long term (current) drug therapy: Secondary | ICD-10-CM | POA: Insufficient documentation

## 2021-04-15 DIAGNOSIS — Z87891 Personal history of nicotine dependence: Secondary | ICD-10-CM | POA: Insufficient documentation

## 2021-04-15 DIAGNOSIS — Z7984 Long term (current) use of oral hypoglycemic drugs: Secondary | ICD-10-CM | POA: Insufficient documentation

## 2021-04-15 DIAGNOSIS — E119 Type 2 diabetes mellitus without complications: Secondary | ICD-10-CM | POA: Insufficient documentation

## 2021-04-15 DIAGNOSIS — Z4802 Encounter for removal of sutures: Secondary | ICD-10-CM | POA: Insufficient documentation

## 2021-04-15 DIAGNOSIS — S8992XD Unspecified injury of left lower leg, subsequent encounter: Secondary | ICD-10-CM | POA: Insufficient documentation

## 2021-04-15 NOTE — ED Triage Notes (Signed)
Pt presents to ED with needing L knee suturees removed. Pt states this was from a recent fall, pt states sutures have been there for 14 days. NAD noted.

## 2021-04-15 NOTE — ED Provider Notes (Signed)
[facility] Emergency Department Provider Note ____________________________________________  Time seen: 1518  I have reviewed the triage vital signs and the nursing notes.  HISTORY  Chief Complaint  Suture / Staple Removal   HPI Russell Greene is a 60 y.o. male presents to the ED 2 weeks after injury to the left knee, with request for suture removal.  Patient sustained a laceration to his ago after mechanical fall.  He denies any interim complaints, except for some recent quadricep muscle pain and swelling from at the foot, after he reported a near fall incident.  Patient had been nonweightbearing with crutches to ambulate, until he nearly slipped yesterday, and was forced to put his foot down.  He denies any outright or fall.  Past Medical History:  Diagnosis Date   Diabetes mellitus without complication [facility])     Patient Active Problem List   Diagnosis Date Noted   Former heavy tobacco smoker 09/19/2020   Tobacco user 03/08/2017   Onychomycosis 01/15/2017   Dyslipidemia 10/16/2016   Diabetes mellitus (HCC) 07/01/2008    History reviewed. No pertinent surgical history.  Prior to Admission medications   Medication Sig Start Date End Date Taking? Authorizing Provider  atorvastatin (LIPITOR) 20 MG tablet Take 1 tablet (20 mg total) by mouth daily. Tome una tableta por boca al dormir 11/02/19   Jacquelin Hawking, PA-C  dapagliflozin propanediol (FARXIGA) 10 MG TABS tablet Farxiga 10 mg tablet  Take 1 tablet every day by oral route for 21 days.    [provider]  insulin NPH-regular Human (70-30) 100 UNIT/ML injection Inject 15 Units into the skin 2 (two) times daily with a meal. 10/06/19   Jacquelin Hawking, PA-C  ketoconazole (NIZORAL) 2 % cream ketoconazole 2 % topical cream  APPLY CREAM TO THE AFFECTED AREA(S) ONCE DAILY FOR 6 WEEKS    [provider]  ketoconazole (NIZORAL) 2 % cream Apply 1 application topically daily. 10/11/20    McDonald, Rachelle Hora, DPM  lisinopril (ZESTRIL) 5 MG tablet Take 1 tablet (5 mg total) by mouth daily. 10/06/19   Jacquelin Hawking, PA-C  lovastatin (MEVACOR) 10 MG tablet lovastatin 10 mg tablet  Take 1 tablet every day by oral route at bedtime.    [provider]  metFORMIN (GLUCOPHAGE) 500 MG tablet Take 1 tablet (500 mg total) by mouth 2 (two) times daily with a meal. Tome una tableta por boca dos veces diarias con comida 11/02/19   Jacquelin Hawking, PA-C    Allergies Patient has no known allergies.  Family History  Problem Relation Age of Onset   Parkinson's disease Father     Social History Social History   Tobacco Use   Smoking status: Former    Packs/day: 0.25    Years: 20.00    Pack years: 5.00    Types: Cigarettes    Quit date: 09/30/2019    Years since quitting: 1.5   Smokeless tobacco: Never  Vaping Use   Vaping Use: Never used  Substance Use Topics   Drug use: Never    Review of Systems  Constitutional: Negative for fever. Eyes: Negative for visual changes. ENT: Negative for sore throat. Cardiovascular: Negative for chest pain. Respiratory: Negative for shortness of breath. Gastrointestinal: Negative for abdominal pain, vomiting and diarrhea. Genitourinary: Negative for dysuria. Musculoskeletal: Negative for back pain. Skin: Negative for rash. Neurological: Negative for headaches, focal weakness or numbness. ____________________________________________  PHYSICAL EXAM:  VITAL SIGNS: ED Triage Vitals [04/15/21 1451]  Enc Vitals Group  BP (!) 133/99     Pulse Rate 77     Resp 18     Temp 98.1 F (36.7 C)     Temp Source Oral     SpO2 98 %     Weight      Height      Head Circumference      Peak Flow      Pain Score      Pain Loc      Pain Edu?      Excl. in GC?     Constitutional: Alert and oriented. Well appearing and in no distress. Head: Normocephalic and atraumatic. Eyes: Conjunctivae are normal. Normal extraocular  movements Cardiovascular: Normal rate, regular rhythm. Normal distal pulses. Respiratory: Normal respiratory effort.  Musculoskeletal: Left knee with a large laceration over the patella with good wound edge approximation and healing.  No dehiscence, erythema, or purulence is noted.  Single running black nylon sutures in place.  Normal flexion and extension range noted.  Nontender with normal range of motion in all extremities.  Neurologic:  Normal gait without ataxia. Normal speech and language. No gross focal neurologic deficits are appreciated. Skin:  Skin is warm, dry and intact. No rash noted. Psychiatric: Mood and affect are normal. Patient exhibits appropriate insight and judgment. ____________________________________________    {LABS (pertinent positives/negatives)  ____________________________________________  {EKG  ____________________________________________   RADIOLOGY Official radiology report(s): No results found. ____________________________________________  PROCEDURES   .Suture Removal  Date/Time: 04/15/2021 3:40 PM Performed by: Lissa Hoard, PA-C Authorized by: Lissa Hoard, PA-C   Consent:    Consent obtained:  Verbal   Consent given by:  Patient   Risks, benefits, and alternatives were discussed: yes     Risks discussed:  Pain Universal protocol:    Patient identity confirmed:  Verbally with patient Location:    Location:  Lower extremity   Lower extremity location:  Knee   Knee location:  L knee Procedure details:    Wound appearance:  No signs of infection, good wound healing and clean   Number of sutures removed:  1 (locked running dermal suture) Post-procedure details:    Post-removal:  Steri-Strips applied   Procedure completion:  Tolerated well, no immediate complications ____________________________________________   INITIAL IMPRESSION / ASSESSMENT AND PLAN / ED COURSE  As part of my medical decision making, I  reviewed the following data within the electronic MEDICAL RECORD NUMBER Notes from prior ED visits   ED evaluation request for suture removal.  Patient presents to be status post an axonal laceration to the left knee with a chainsaw.  The injury resulted in a large linear laceration across the anterior patella.  Patient presents with a wound that is well-healed with a single running nylon stitch in place.  Patient tolerated suture removal without difficulty.  No wound dehiscence is noted.  Steri-Strips were placed for comfort and patient is able to demonstrate normal ambulation without assistance of crutches.  Russell Greene was evaluated in Emergency Department on 04/15/2021 for the symptoms described in the history of present illness. He was evaluated in the context of the global COVID-19 pandemic, which necessitated consideration that the patient might be at risk for infection with the SARS-CoV-2 virus that causes COVID-19. Institutional protocols and algorithms that pertain to the evaluation of patients at risk for COVID-19 are in a state of rapid change based on information released by regulatory bodies including the CDC and federal and state organizations. These policies  and algorithms were followed during the patient's care in the ED. ____________________________________________  FINAL CLINICAL IMPRESSION(S) / ED DIAGNOSES  Final diagnoses:  Visit for suture removal      Karmen Stabs, Charlesetta Ivory, PA-C 04/15/21 1743    Shaune Pollack, MD 04/15/21 2002

## 2021-04-15 NOTE — ED Notes (Signed)
Dc instructions reviewed with pt no questions or concerns at this time. Will follow up as needed. Pt ambulated with crutches out of the ed with family member. Denies any pain or discomfort

## 2021-04-15 NOTE — ED Notes (Signed)
EDP at bedside assessing pt at this time.

## 2021-04-15 NOTE — Discharge Instructions (Addendum)
Keep the wound clean, dry, and covered.   Please Call Sheran Luz, RN to donate supplies [phone]

## 2022-10-02 IMAGING — CR DG KNEE COMPLETE 4+V*L*
4 series · 4 of 4 positions shown · non-contrast
Comparison: None.

CLINICAL DATA: Left knee laceration from chainsaw injury.

EXAM:
LEFT KNEE - COMPLETE 4+ VIEW

[knee ap]
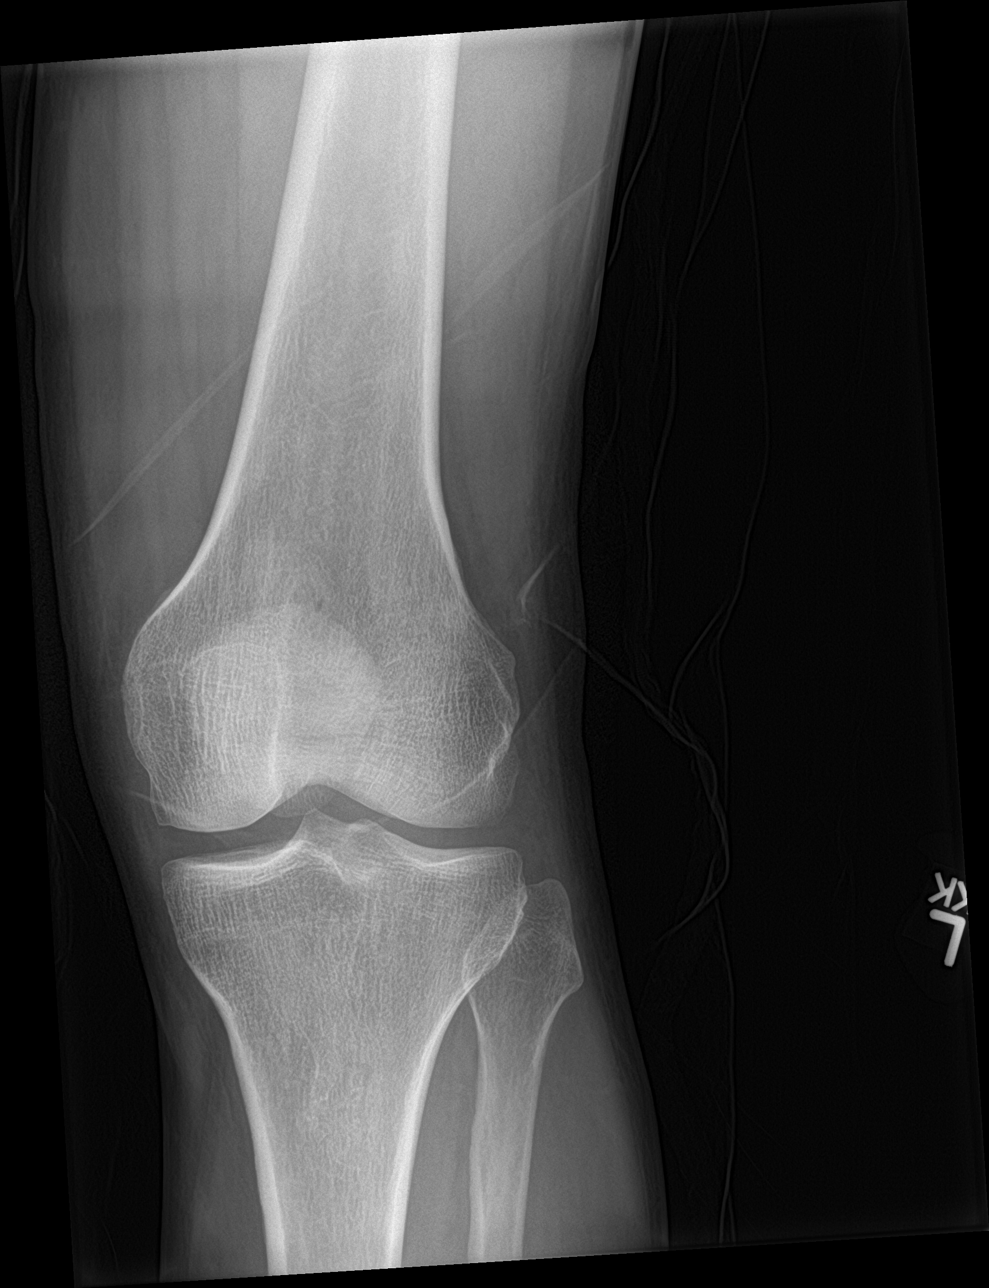

[knee obl (1 of 2)]
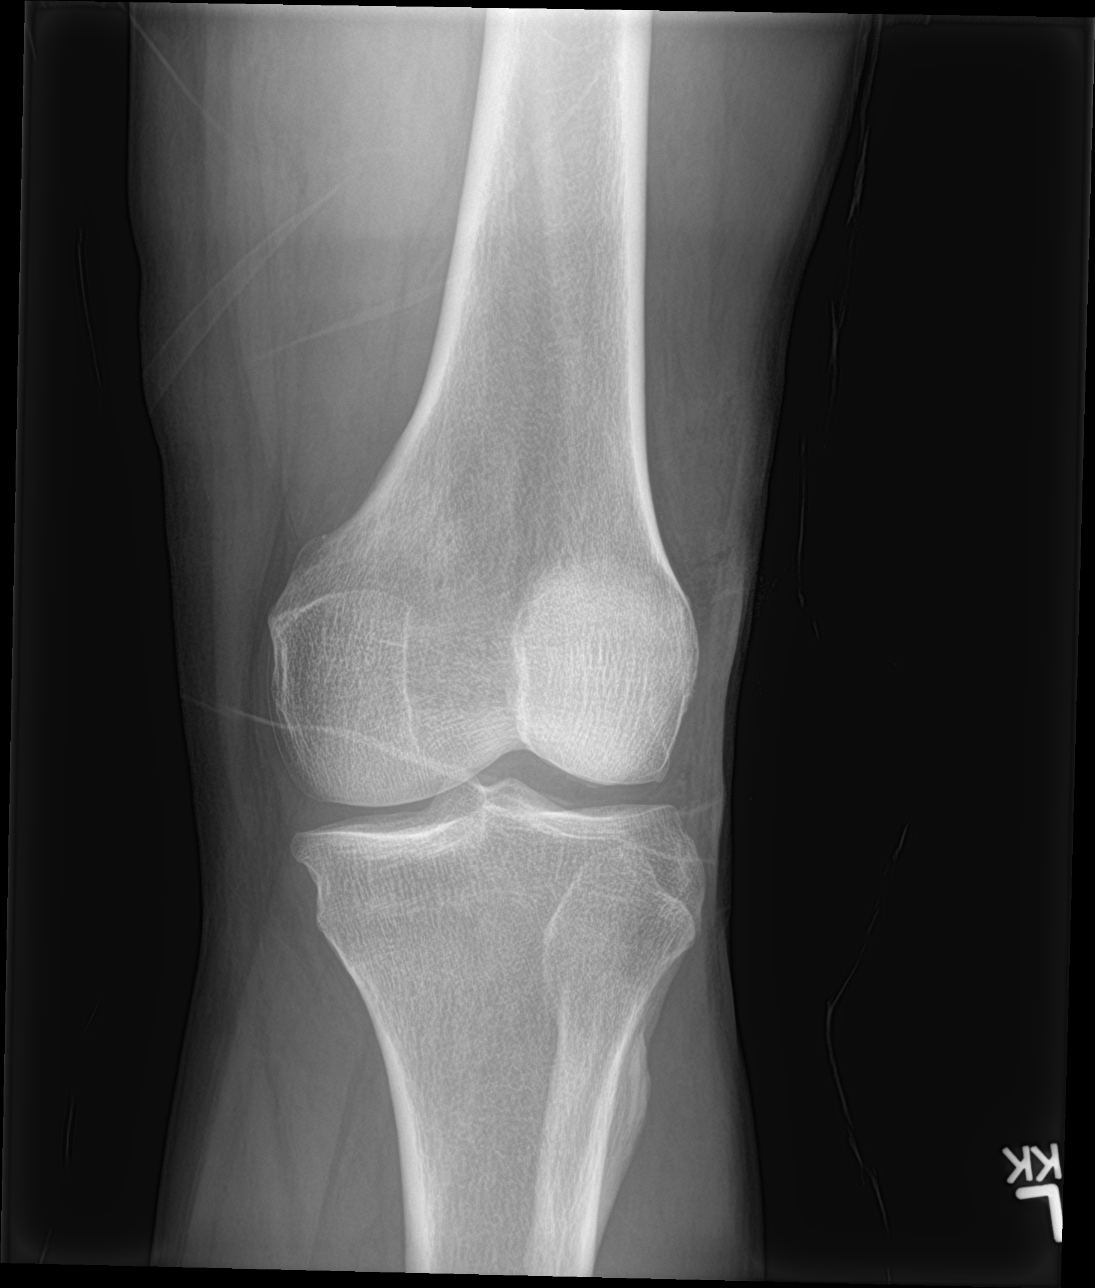

[knee obl (2 of 2)]
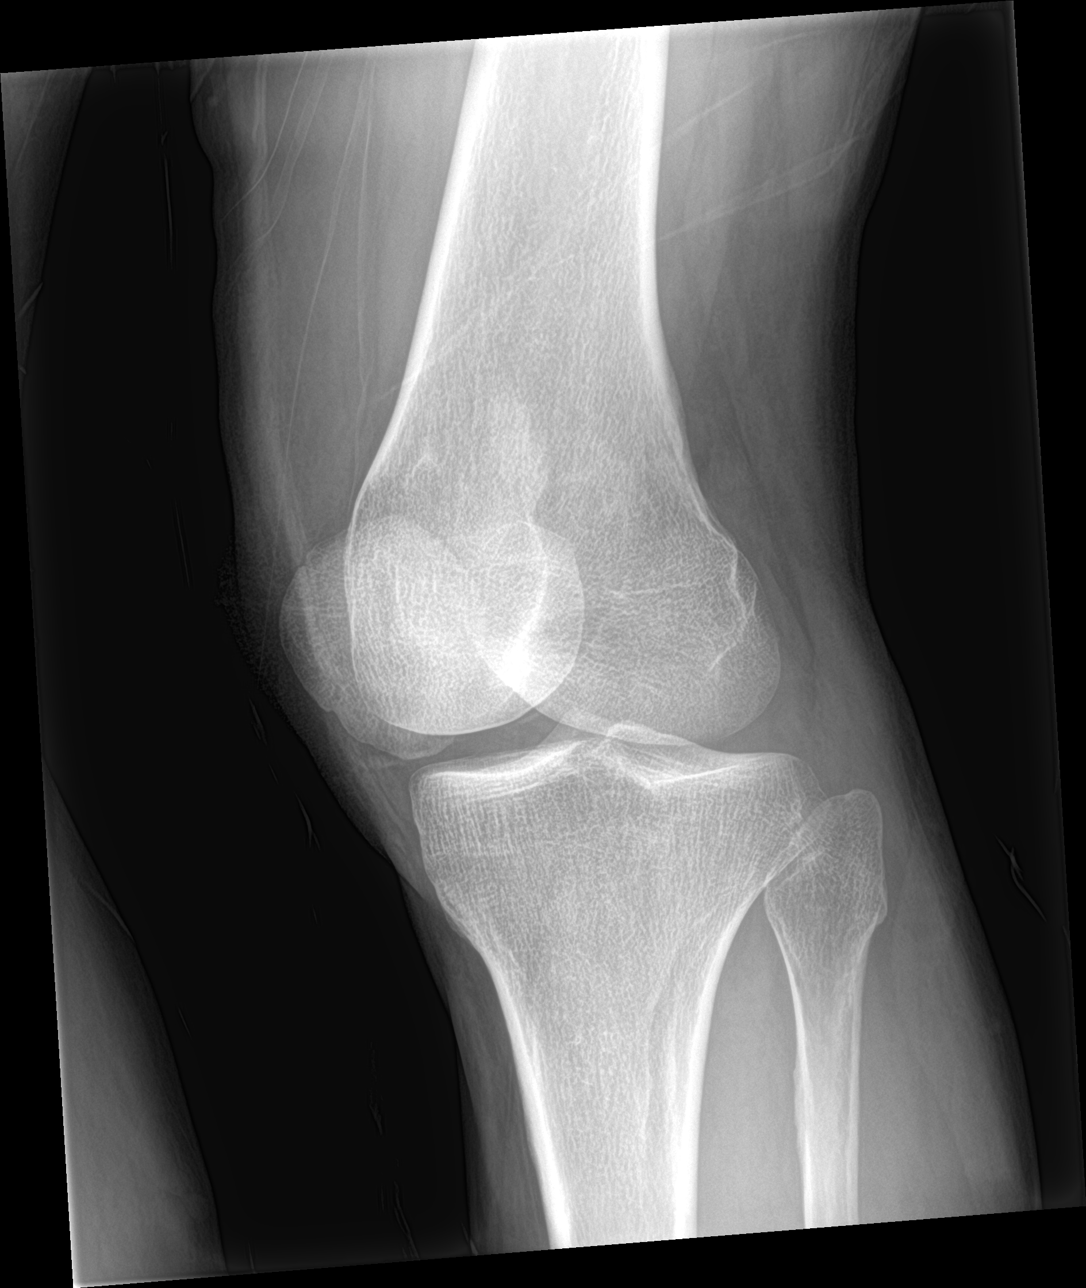

[knee lat]
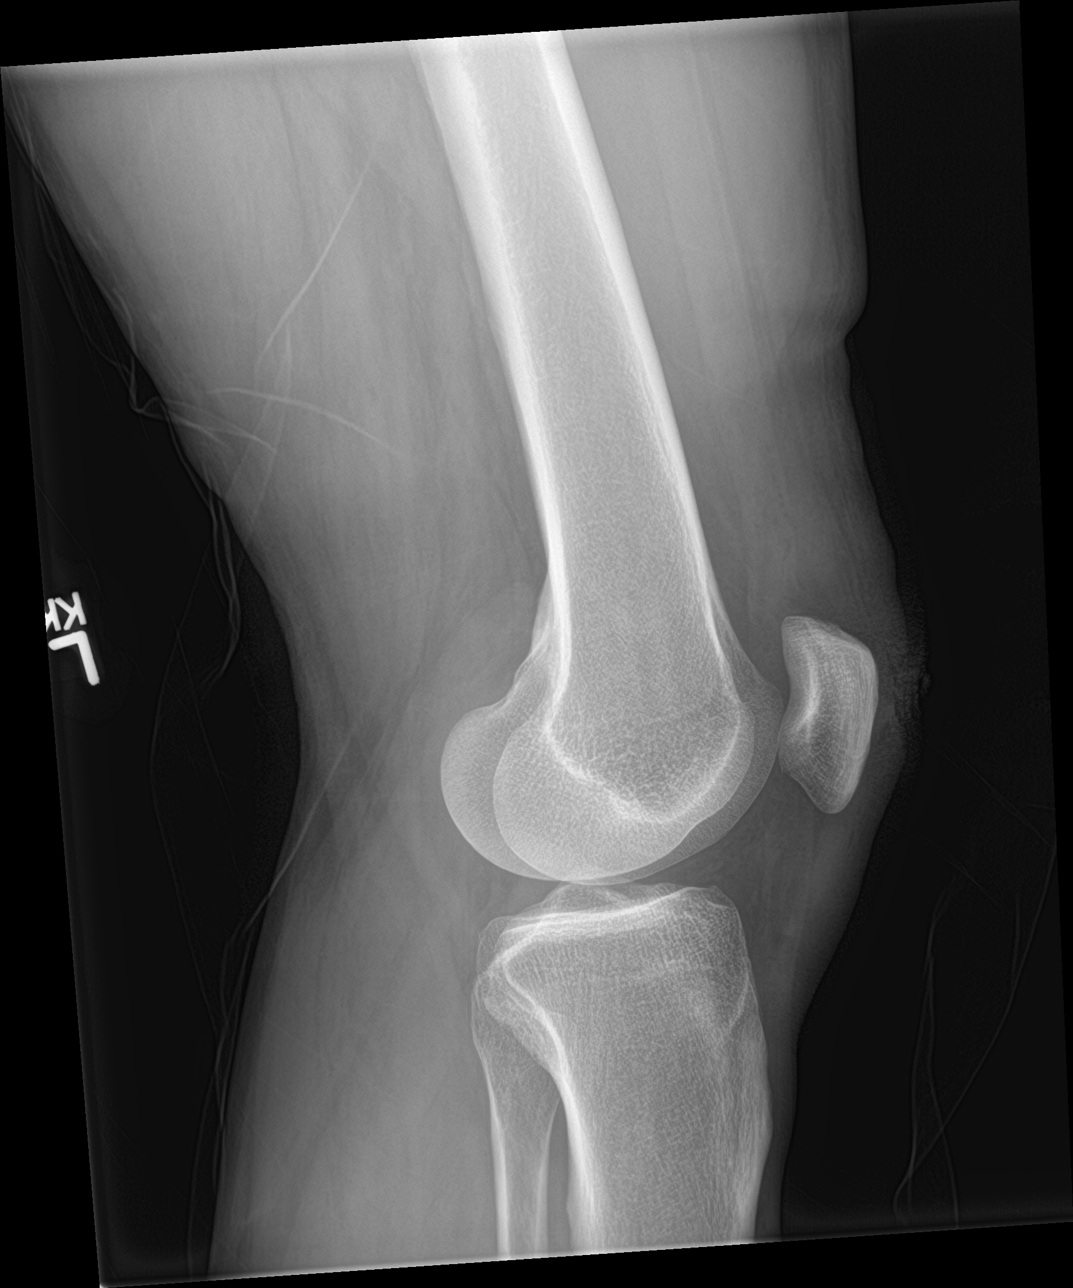

[4 of 4 positions shown; findings below may reference images not displayed]

FINDINGS: No evidence of fracture, dislocation, or joint effusion. No evidence
of arthropathy or other focal bone abnormality. Soft tissues are
unremarkable.
IMPRESSION: Negative.

## 2022-11-18 ENCOUNTER — Ambulatory Visit: Payer: Self-pay | Admitting: Internal Medicine

## 2022-11-18 ENCOUNTER — Encounter: Payer: Self-pay | Admitting: Internal Medicine

## 2022-11-18 VITALS — BP 130/72 | HR 79 | Ht 68.0 in | Wt 185.0 lb

## 2022-11-18 DIAGNOSIS — E1165 Type 2 diabetes mellitus with hyperglycemia: Secondary | ICD-10-CM

## 2022-11-18 DIAGNOSIS — I10 Essential (primary) hypertension: Secondary | ICD-10-CM

## 2022-11-18 DIAGNOSIS — E782 Mixed hyperlipidemia: Secondary | ICD-10-CM

## 2022-11-18 LAB — POC CREATINE & ALBUMIN,URINE
Creatinine, POC: 50 mg/dL
Microalbumin Ur, POC: 30 mg/L

## 2022-11-18 LAB — POCT CBG (FASTING - GLUCOSE)-MANUAL ENTRY: Glucose Fasting, POC: 278 mg/dL — AB (ref 70–99)

## 2022-11-18 MED ORDER — DAPAGLIFLOZIN PROPANEDIOL 10 MG PO TABS: 10.0000 mg | ORAL_TABLET | Freq: Every day | ORAL | 6 refills | Status: DC

## 2022-11-18 MED ORDER — LISINOPRIL 10 MG PO TABS: 10.0000 mg | ORAL_TABLET | Freq: Every day | ORAL | 3 refills | Status: DC

## 2022-11-18 NOTE — Progress Notes (Signed)
New Patient Office Visit  Subjective    Patient ID: Russell Greene, male    DOB: [birth year]  Age: 62 y.o. MRN: [id number]  CC:  Chief Complaint  Patient presents with   Establish Care    New Patient    HPI Russell Greene presents to establish care  He does not have additional concerns to discuss today.   Patient is here with his son to establish primary care.  He has longstanding history of uncontrolled diabetes.  He says his diabetes was diagnosed 14 years ago and since then he has been taking several medications.  However it seems like he was not taking them  as prescribed.  Today patient admits that he is only taking his insulin 70/30 once in the morning.  He is also not taking his [place] as prescribed.  And he only takes his metformin once a day instead of twice a day. He denies any chest pain or shortness of breath.  He does not have any visual problems but he will get an eye exam very soon.  He admits to having some numbness and burning pain in the soles of his feet. Patient agrees to get lab work today.  His medications were explained and advised how to take them properly.  His blood pressure is uncontrolled on lisinopril 5 mg, will increase the dose to 10 mg/day.  Patient will return to discuss the lab results, and to further adjust his medications.  Diabetes Pertinent negatives for hypoglycemia include no dizziness, headaches, nervousness/anxiousness or tremors. Pertinent negatives for diabetes include no blurred vision, no chest pain and no weight loss.    Outpatient Encounter Medications as of 11/18/2022  Medication Sig   atorvastatin (LIPITOR) 40 MG tablet Take 40 mg by mouth daily.   dapagliflozin propanediol (FARXIGA) 10 MG TABS tablet Farxiga 10 mg tablet  Take 1 tablet every day by oral route for 21 days.   dapagliflozin propanediol (FARXIGA) 10 MG TABS tablet Take 1 tablet (10 mg total) by mouth daily before breakfast.   insulin NPH-regular Human (70-30) 100  UNIT/ML injection Inject 15 Units into the skin 2 (two) times daily with a meal.   lisinopril (ZESTRIL) 10 MG tablet Take 1 tablet (10 mg total) by mouth daily.   metFORMIN (GLUCOPHAGE) 1000 MG tablet Take 1,000 mg by mouth 2 (two) times daily with a meal.   [DISCONTINUED] lisinopril (ZESTRIL) 5 MG tablet Take 1 tablet (5 mg total) by mouth daily.   [DISCONTINUED] atorvastatin (LIPITOR) 20 MG tablet Take 1 tablet (20 mg total) by mouth daily. Tome una tableta por boca al dormir   [DISCONTINUED] ketoconazole (NIZORAL) 2 % cream ketoconazole 2 % topical cream  APPLY CREAM TO THE AFFECTED AREA(S) ONCE DAILY FOR 6 WEEKS   [DISCONTINUED] ketoconazole (NIZORAL) 2 % cream Apply 1 application topically daily.   [DISCONTINUED] lovastatin (MEVACOR) 10 MG tablet lovastatin 10 mg tablet  Take 1 tablet every day by oral route at bedtime.   [DISCONTINUED] metFORMIN (GLUCOPHAGE) 500 MG tablet Take 1 tablet (500 mg total) by mouth 2 (two) times daily with a meal. Tome una tableta por boca dos veces diarias con comida   No facility-administered encounter medications on file as of 11/18/2022.    Past Medical History:  Diagnosis Date   Diabetes mellitus without complication (HCC)     History reviewed. No pertinent surgical history.  Family History  Problem Relation Age of Onset   Parkinson's disease Father     Social History  Socioeconomic History   Marital status: Married    Spouse name: Not on file   Number of children: Not on file   Years of education: Not on file   Highest education level: Not on file  Occupational History   Not on file  Tobacco Use   Smoking status: Former    Packs/day: 0.25    Years: 20.00    Additional pack years: 0.00    Total pack years: 5.00    Types: Cigarettes    Quit date: 09/30/2019    Years since quitting: 3.1   Smokeless tobacco: Never  Vaping Use   Vaping Use: Never used  Substance and Sexual Activity   Alcohol use: Not on file    Comment: no   Drug  use: Never   Sexual activity: Not on file  Other Topics Concern   Not on file  Social History Narrative   Not on file   Social Determinants of Health   Financial Resource Strain: Not on file  Food Insecurity: Not on file  Transportation Needs: Not on file  Physical Activity: Not on file  Stress: Not on file  Social Connections: Not on file  Intimate Partner Violence: Not on file    Review of Systems  Constitutional:  Negative for chills, diaphoresis, fever, malaise/fatigue and weight loss.  HENT:  Negative for congestion, ear pain, hearing loss, sinus pain and tinnitus.   Eyes:  Negative for blurred vision, double vision, photophobia, pain, discharge and redness.  Respiratory:  Negative for cough, shortness of breath, wheezing and stridor.   Cardiovascular:  Negative for chest pain, palpitations, leg swelling and PND.  Gastrointestinal:  Negative for abdominal pain, blood in stool, constipation, diarrhea, heartburn, melena, nausea and vomiting.  Genitourinary:  Negative for dysuria, flank pain, frequency, hematuria and urgency.  Musculoskeletal:  Negative for back pain, joint pain, myalgias and neck pain.  Skin: Negative.   Neurological:  Negative for dizziness, tremors, sensory change, speech change, focal weakness and headaches.  Psychiatric/Behavioral:  Negative for depression, hallucinations, memory loss, substance abuse and suicidal ideas. The patient is not nervous/anxious and does not have insomnia.         Objective    BP 130/72   Pulse 79   Ht 5\' 8"  (1.727 m)   Wt 185 lb (83.9 kg)   SpO2 95%   BMI 28.13 kg/m   Physical Exam Vitals and nursing note reviewed.  Constitutional:      Appearance: Normal appearance. He is normal weight.  HENT:     Right Ear: Tympanic membrane normal.     Left Ear: Tympanic membrane normal.     Mouth/Throat:     Mouth: Mucous membranes are dry.     Pharynx: No oropharyngeal exudate or posterior oropharyngeal erythema.   Cardiovascular:     Rate and Rhythm: Normal rate and regular rhythm.     Heart sounds: Normal heart sounds. No murmur heard. Pulmonary:     Effort: Pulmonary effort is normal.     Breath sounds: Normal breath sounds. No wheezing, rhonchi or rales.  Chest:     Chest wall: No tenderness.  Abdominal:     General: Bowel sounds are normal.     Palpations: Abdomen is soft. There is no mass.     Tenderness: There is no abdominal tenderness. There is no right CVA tenderness, left CVA tenderness or guarding.     Hernia: No hernia is present.  Musculoskeletal:        General:  No swelling, tenderness, deformity or signs of injury. Normal range of motion.     Cervical back: Normal range of motion and neck supple.     Right lower leg: No edema.     Left lower leg: No edema.  Feet:     Right foot:     Skin integrity: Skin integrity normal. No ulcer, blister or skin breakdown.     Left foot:     Skin integrity: Skin integrity normal. No ulcer or blister.  Skin:    General: Skin is warm and dry.     Findings: No lesion or rash.  Neurological:     General: No focal deficit present.     Mental Status: He is alert and oriented to person, place, and time.     Cranial Nerves: No cranial nerve deficit.     Sensory: No sensory deficit.     Motor: No weakness.     Coordination: Coordination normal.     Gait: Gait normal.     Deep Tendon Reflexes: Reflexes normal.  Psychiatric:        Mood and Affect: Mood normal.        Behavior: Behavior normal.        Assessment & Plan:  Check labs today.  Meds adjusted.  Patient will return next week to discuss labs. May need to add gabapentin for diabetic neuropathy.  Set up diabetic eye exam.  Urine microalbumin done today. Problem List Items Addressed This Visit     Diabetes mellitus (HCC) - Primary   Relevant Medications   metFORMIN (GLUCOPHAGE) 1000 MG tablet   atorvastatin (LIPITOR) 40 MG tablet   dapagliflozin propanediol (FARXIGA) 10 MG TABS  tablet   lisinopril (ZESTRIL) 10 MG tablet   Other Relevant Orders   POCT CBG (Fasting - Glucose) (Completed)   Hemoglobin A1c   POC CREATINE & ALBUMIN,URINE (Completed)   Essential hypertension, benign   Relevant Medications   atorvastatin (LIPITOR) 40 MG tablet   lisinopril (ZESTRIL) 10 MG tablet   Other Relevant Orders   CBC With Differential   CMP14+EGFR   Mixed hyperlipidemia   Relevant Medications   atorvastatin (LIPITOR) 40 MG tablet   lisinopril (ZESTRIL) 10 MG tablet   Other Relevant Orders   Lipid Profile    Return in about 1 week (around 11/25/2022).   Total time spent: 45 minutes  Margaretann Loveless, MD  11/18/2022   This document may have been prepared by [facility] Voice Recognition software and as such may include unintentional dictation errors.

## 2022-11-19 ENCOUNTER — Other Ambulatory Visit: Payer: Self-pay

## 2022-11-21 ENCOUNTER — Other Ambulatory Visit: Payer: Self-pay

## 2022-11-22 LAB — CBC WITH DIFFERENTIAL
Basophils Absolute: 0 10*3/uL (ref 0.0–0.2)
Basos: 1 %
EOS (ABSOLUTE): 0.1 10*3/uL (ref 0.0–0.4)
Eos: 2 %
Hematocrit: 50 % (ref 37.5–51.0)
Hemoglobin: 16.7 g/dL (ref 13.0–17.7)
Immature Grans (Abs): 0 10*3/uL (ref 0.0–0.1)
Immature Granulocytes: 0 %
Lymphocytes Absolute: 1.6 10*3/uL (ref 0.7–3.1)
Lymphs: 22 %
MCH: 29.7 pg (ref 26.6–33.0)
MCHC: 33.4 g/dL (ref 31.5–35.7)
MCV: 89 fL (ref 79–97)
Monocytes Absolute: 0.7 10*3/uL (ref 0.1–0.9)
Monocytes: 10 %
Neutrophils Absolute: 4.7 10*3/uL (ref 1.4–7.0)
Neutrophils: 65 %
RBC: 5.63 x10E6/uL (ref 4.14–5.80)
RDW: 12.5 % (ref 11.6–15.4)
WBC: 7.2 10*3/uL (ref 3.4–10.8)

## 2022-11-22 LAB — HEMOGLOBIN A1C
Est. average glucose Bld gHb Est-mCnc: 263 mg/dL
Hgb A1c MFr Bld: 10.8 % — ABNORMAL HIGH (ref 4.8–5.6)

## 2022-11-22 LAB — CMP14+EGFR
ALT: 29 IU/L (ref 0–44)
AST: 23 IU/L (ref 0–40)
Albumin/Globulin Ratio: 2 (ref 1.2–2.2)
Albumin: 4.1 g/dL (ref 3.8–4.9)
Alkaline Phosphatase: 89 IU/L (ref 44–121)
BUN/Creatinine Ratio: 21 (ref 10–24)
BUN: 16 mg/dL (ref 8–27)
Bilirubin Total: 0.6 mg/dL (ref 0.0–1.2)
CO2: 20 mmol/L (ref 20–29)
Calcium: 8.7 mg/dL (ref 8.6–10.2)
Chloride: 100 mmol/L (ref 96–106)
Creatinine, Ser: 0.78 mg/dL (ref 0.76–1.27)
Globulin, Total: 2.1 g/dL (ref 1.5–4.5)
Glucose: 182 mg/dL — ABNORMAL HIGH (ref 70–99)
Potassium: 4.5 mmol/L (ref 3.5–5.2)
Sodium: 137 mmol/L (ref 134–144)
Total Protein: 6.2 g/dL (ref 6.0–8.5)
eGFR: 102 mL/min/{1.73_m2} (ref >59)

## 2022-11-22 LAB — LIPID PANEL
Chol/HDL Ratio: 4.8 ratio (ref 0.0–5.0)
Cholesterol, Total: 149 mg/dL (ref 100–199)
HDL: 31 mg/dL — ABNORMAL LOW (ref >39)
LDL Chol Calc (NIH): 86 mg/dL (ref 0–99)
Triglycerides: 186 mg/dL — ABNORMAL HIGH (ref 0–149)
VLDL Cholesterol Cal: 32 mg/dL (ref 5–40)

## 2022-11-25 ENCOUNTER — Ambulatory Visit: Payer: Self-pay | Admitting: Internal Medicine

## 2022-11-25 ENCOUNTER — Encounter: Payer: Self-pay | Admitting: Internal Medicine

## 2022-11-25 VITALS — BP 120/62 | HR 70 | Ht 68.0 in | Wt 186.2 lb

## 2022-11-25 DIAGNOSIS — I10 Essential (primary) hypertension: Secondary | ICD-10-CM

## 2022-11-25 DIAGNOSIS — E0842 Diabetes mellitus due to underlying condition with diabetic polyneuropathy: Secondary | ICD-10-CM | POA: Insufficient documentation

## 2022-11-25 DIAGNOSIS — E782 Mixed hyperlipidemia: Secondary | ICD-10-CM

## 2022-11-25 DIAGNOSIS — E1165 Type 2 diabetes mellitus with hyperglycemia: Secondary | ICD-10-CM

## 2022-11-25 LAB — POCT CBG (FASTING - GLUCOSE)-MANUAL ENTRY: Glucose Fasting, POC: 215 mg/dL — AB (ref 70–99)

## 2022-11-25 MED ORDER — DAPAGLIFLOZIN PROPANEDIOL 10 MG PO TABS: 10.0000 mg | ORAL_TABLET | Freq: Every day | ORAL | 6 refills | Status: DC

## 2022-11-25 MED ORDER — GABAPENTIN 100 MG PO CAPS: 100.0000 mg | ORAL_CAPSULE | Freq: Every day | ORAL | 6 refills | Status: DC

## 2022-11-25 MED ORDER — INSULIN NPH ISOPHANE & REGULAR (70-30) 100 UNIT/ML ~~LOC~~ SUSP: 25.0000 [IU] | Freq: Two times a day (BID) | SUBCUTANEOUS | 99 refills | Status: DC

## 2022-11-25 NOTE — Progress Notes (Signed)
Established Patient Office Visit  Subjective:  Patient ID: Russell Greene, male    DOB: [birth year]  Age: 62 y.o. MRN: [id number]  Chief Complaint  Patient presents with   Follow-up    1 week follow up    Patient comes in for his follow-up today.  He is in good spirits.  His recent blood work showed a hemoglobin A1c of 10.8.  Patient understands that he has to take his insulin 70/30 twice a day which he just recently started going.  His current dose is 20 units twice a day.  However his fingerstick today is 215. Patient instructed to increase his insulin to 25 units twice a day.  He may titrate up every 5 days. His blood pressure is much better on lisinopril 10 mg/day. Still needs to pick up gabapentin for his diabetic neuropathy. He is going to get an eye exam soon.  Patient does not have insurance yet. Patient has no other complaints today.    No other concerns at this time.   Past Medical History:  Diagnosis Date   Diabetes mellitus without complication (HCC)     History reviewed. No pertinent surgical history.  Social History   Socioeconomic History   Marital status: Married    Spouse name: Not on file   Number of children: Not on file   Years of education: Not on file   Highest education level: Not on file  Occupational History   Not on file  Tobacco Use   Smoking status: Former    Packs/day: 0.25    Years: 20.00    Additional pack years: 0.00    Total pack years: 5.00    Types: Cigarettes    Quit date: 09/30/2019    Years since quitting: 3.1   Smokeless tobacco: Never  Vaping Use   Vaping Use: Never used  Substance and Sexual Activity   Alcohol use: Not on file    Comment: no   Drug use: Never   Sexual activity: Not on file  Other Topics Concern   Not on file  Social History Narrative   Not on file   Social Determinants of Health   Financial Resource Strain: Not on file  Food Insecurity: Not on file  Transportation Needs: Not on file  Physical  Activity: Not on file  Stress: Not on file  Social Connections: Not on file  Intimate Partner Violence: Not on file    Family History  Problem Relation Age of Onset   Parkinson's disease Father     No Known Allergies  Review of Systems  Constitutional:  Negative for chills, fever, malaise/fatigue and weight loss.  HENT:  Negative for hearing loss, nosebleeds, sore throat and tinnitus.   Eyes:  Negative for blurred vision, double vision, photophobia, pain, discharge and redness.  Respiratory:  Negative for cough, sputum production, shortness of breath and wheezing.   Cardiovascular:  Negative for chest pain, palpitations, leg swelling and PND.  Gastrointestinal:  Negative for abdominal pain, blood in stool, heartburn, melena, nausea and vomiting.  Genitourinary:  Negative for dysuria, flank pain, frequency and urgency.  Musculoskeletal:  Negative for back pain and myalgias.  Skin:  Negative for rash.  Neurological:  Negative for dizziness, tingling, tremors, seizures, weakness and headaches.  Psychiatric/Behavioral:  Negative for depression and memory loss. The patient is not nervous/anxious and does not have insomnia.        Objective:   BP 120/62   Pulse 70   Ht 5\' 8"  (  1.727 m)   Wt 186 lb 3.2 oz (84.5 kg)   SpO2 96%   BMI 28.31 kg/m   Vitals:   11/25/22 0936  BP: 120/62  Pulse: 70  Height: 5\' 8"  (1.727 m)  Weight: 186 lb 3.2 oz (84.5 kg)  SpO2: 96%  BMI (Calculated): 28.32    Physical Exam Vitals and nursing note reviewed.  Constitutional:      Appearance: Normal appearance.  HENT:     Head: Normocephalic and atraumatic.     Nose: Nose normal.     Mouth/Throat:     Mouth: Mucous membranes are dry.  Cardiovascular:     Rate and Rhythm: Normal rate and regular rhythm.     Pulses: Normal pulses.     Heart sounds: Normal heart sounds. No murmur heard.    No friction rub.  Pulmonary:     Effort: Pulmonary effort is normal.     Breath sounds: Normal breath  sounds.  Abdominal:     General: Abdomen is flat. Bowel sounds are normal.     Palpations: Abdomen is soft.  Musculoskeletal:        General: No swelling, tenderness, deformity or signs of injury. Normal range of motion.     Cervical back: Normal range of motion and neck supple. No tenderness.     Right lower leg: No edema.  Lymphadenopathy:     Cervical: No cervical adenopathy.  Skin:    General: Skin is warm and dry.  Neurological:     General: No focal deficit present.     Mental Status: He is alert and oriented to person, place, and time.  Psychiatric:        Mood and Affect: Mood normal.        Behavior: Behavior normal.      Results for orders placed or performed in visit on 11/25/22  POCT CBG (Fasting - Glucose)  Result Value Ref Range   Glucose Fasting, POC 215 (A) 70 - 99 mg/dL    Recent Results (from the past 2160 hour(s))  POCT CBG (Fasting - Glucose)     Status: Abnormal   Collection Time: 11/18/22  2:24 PM  Result Value Ref Range   Glucose Fasting, POC 278 (A) 70 - 99 mg/dL    Comment: non-fasting  POC CREATINE & ALBUMIN,URINE     Status: Abnormal   Collection Time: 11/18/22  2:55 PM  Result Value Ref Range   Microalbumin Ur, POC 30 mg/L   Creatinine, POC 50 mg/dL   Albumin/Creatinine Ratio, Urine, POC 30-300   CBC With Differential     Status: None   Collection Time: 11/21/22  8:42 AM  Result Value Ref Range   WBC 7.2 3.4 - 10.8 x10E3/uL   RBC 5.63 4.14 - 5.80 x10E6/uL   Hemoglobin 16.7 13.0 - 17.7 g/dL   Hematocrit 16.1 09.6 - 51.0 %   MCV 89 79 - 97 fL   MCH 29.7 26.6 - 33.0 pg   MCHC 33.4 31.5 - 35.7 g/dL   RDW 04.5 40.9 - 81.1 %   Neutrophils 65 Not Estab. %   Lymphs 22 Not Estab. %   Monocytes 10 Not Estab. %   Eos 2 Not Estab. %   Basos 1 Not Estab. %   Neutrophils Absolute 4.7 1.4 - 7.0 x10E3/uL   Lymphocytes Absolute 1.6 0.7 - 3.1 x10E3/uL   Monocytes Absolute 0.7 0.1 - 0.9 x10E3/uL   EOS (ABSOLUTE) 0.1 0.0 - 0.4 x10E3/uL   Basophils  Absolute 0.0 0.0 - 0.2 x10E3/uL   Immature Granulocytes 0 Not Estab. %   Immature Grans (Abs) 0.0 0.0 - 0.1 x10E3/uL  CMP14+EGFR     Status: Abnormal   Collection Time: 11/21/22  8:42 AM  Result Value Ref Range   Glucose 182 (H) 70 - 99 mg/dL   BUN 16 8 - 27 mg/dL   Creatinine, Ser 8.41 0.76 - 1.27 mg/dL   eGFR 660 >63 KZ/SWF/0.93   BUN/Creatinine Ratio 21 10 - 24   Sodium 137 134 - 144 mmol/L   Potassium 4.5 3.5 - 5.2 mmol/L   Chloride 100 96 - 106 mmol/L   CO2 20 20 - 29 mmol/L   Calcium 8.7 8.6 - 10.2 mg/dL   Total Protein 6.2 6.0 - 8.5 g/dL   Albumin 4.1 3.8 - 4.9 g/dL   Globulin, Total 2.1 1.5 - 4.5 g/dL   Albumin/Globulin Ratio 2.0 1.2 - 2.2   Bilirubin Total 0.6 0.0 - 1.2 mg/dL   Alkaline Phosphatase 89 44 - 121 IU/L   AST 23 0 - 40 IU/L   ALT 29 0 - 44 IU/L  Lipid Profile     Status: Abnormal   Collection Time: 11/21/22  8:42 AM  Result Value Ref Range   Cholesterol, Total 149 100 - 199 mg/dL   Triglycerides 235 (H) 0 - 149 mg/dL   HDL 31 (L) >57 mg/dL   VLDL Cholesterol Cal 32 5 - 40 mg/dL   LDL Chol Calc (NIH) 86 0 - 99 mg/dL   Chol/HDL Ratio 4.8 0.0 - 5.0 ratio    Comment:                                   T. Chol/HDL Ratio                                             Men  Women                               1/2 Avg.Risk  3.4    3.3                                   Avg.Risk  5.0    4.4                                2X Avg.Risk  9.6    7.1                                3X Avg.Risk 23.4   11.0   Hemoglobin A1c     Status: Abnormal   Collection Time: 11/21/22  8:42 AM  Result Value Ref Range   Hgb A1c MFr Bld 10.8 (H) 4.8 - 5.6 %    Comment:          Prediabetes: 5.7 - 6.4          Diabetes: >6.4          Glycemic control for adults with diabetes: <7.0    Est. average glucose Bld gHb  Est-mCnc 263 mg/dL  POCT CBG (Fasting - Glucose)     Status: Abnormal   Collection Time: 11/25/22  9:39 AM  Result Value Ref Range   Glucose Fasting, POC 215 (A) 70 - 99  mg/dL      Assessment & Plan:  Patient advised strict diet control. Also advised to increase his insulin 70/30 mix to 25 units, twice a day 12 hours apart. To continue other medications as such. Gabapentin also sent for his diabetic neuropathy. Problem List Items Addressed This Visit     Diabetes mellitus (HCC) - Primary   Relevant Medications   dapagliflozin propanediol (FARXIGA) 10 MG TABS tablet   insulin NPH-regular Human (70-30) 100 UNIT/ML injection   Other Relevant Orders   POCT CBG (Fasting - Glucose) (Completed)   Essential hypertension, benign   Mixed hyperlipidemia   Diabetic polyneuropathy associated with diabetes mellitus due to underlying condition (HCC)   Relevant Medications   dapagliflozin propanediol (FARXIGA) 10 MG TABS tablet   insulin NPH-regular Human (70-30) 100 UNIT/ML injection   gabapentin (NEURONTIN) 100 MG capsule    Return in about 4 weeks (around 12/23/2022).   Total time spent: 30 minutes  Margaretann Loveless, MD  11/25/2022   This document may have been prepared by [facility] Voice Recognition software and as such may include unintentional dictation errors.

## 2022-12-26 ENCOUNTER — Ambulatory Visit: Payer: Self-pay | Admitting: Internal Medicine

## 2023-05-12 ENCOUNTER — Encounter: Payer: Self-pay | Admitting: Internal Medicine

## 2023-05-12 ENCOUNTER — Ambulatory Visit (INDEPENDENT_AMBULATORY_CARE_PROVIDER_SITE_OTHER): Payer: Self-pay | Admitting: Podiatry

## 2023-05-12 ENCOUNTER — Encounter: Payer: Self-pay | Admitting: Podiatry

## 2023-05-12 ENCOUNTER — Ambulatory Visit: Payer: Self-pay | Admitting: Internal Medicine

## 2023-05-12 VITALS — BP 130/80 | HR 78 | Ht 68.0 in | Wt 187.8 lb

## 2023-05-12 DIAGNOSIS — I10 Essential (primary) hypertension: Secondary | ICD-10-CM

## 2023-05-12 DIAGNOSIS — B353 Tinea pedis: Secondary | ICD-10-CM

## 2023-05-12 DIAGNOSIS — E1165 Type 2 diabetes mellitus with hyperglycemia: Secondary | ICD-10-CM

## 2023-05-12 DIAGNOSIS — Z794 Long term (current) use of insulin: Secondary | ICD-10-CM

## 2023-05-12 DIAGNOSIS — E782 Mixed hyperlipidemia: Secondary | ICD-10-CM

## 2023-05-12 DIAGNOSIS — E1169 Type 2 diabetes mellitus with other specified complication: Secondary | ICD-10-CM

## 2023-05-12 DIAGNOSIS — E0842 Diabetes mellitus due to underlying condition with diabetic polyneuropathy: Secondary | ICD-10-CM

## 2023-05-12 LAB — GLUCOSE, POCT (MANUAL RESULT ENTRY): POC Glucose: 292 mg/dL — AB (ref 70–99)

## 2023-05-12 MED ORDER — LISINOPRIL 10 MG PO TABS
10.0000 mg | ORAL_TABLET | Freq: Every day | ORAL | 3 refills | Status: AC
Start: 2023-05-12 — End: 2024-05-11

## 2023-05-12 MED ORDER — METFORMIN HCL 1000 MG PO TABS: 1000.0000 mg | ORAL_TABLET | Freq: Two times a day (BID) | ORAL | 3 refills | Status: AC

## 2023-05-12 MED ORDER — GABAPENTIN 100 MG PO CAPS
100.0000 mg | ORAL_CAPSULE | Freq: Every day | ORAL | 6 refills | Status: AC
Start: 2023-05-12 — End: 2023-12-08

## 2023-05-12 MED ORDER — KETOCONAZOLE 2 % EX CREA: 1.0000 | TOPICAL_CREAM | Freq: Every day | CUTANEOUS | 2 refills | Status: DC

## 2023-05-12 MED ORDER — ATORVASTATIN CALCIUM 40 MG PO TABS: 40.0000 mg | ORAL_TABLET | Freq: Every day | ORAL | 3 refills | Status: AC

## 2023-05-12 MED ORDER — DAPAGLIFLOZIN PROPANEDIOL 10 MG PO TABS: 10.0000 mg | ORAL_TABLET | Freq: Every day | ORAL | 6 refills | Status: DC

## 2023-05-12 MED ORDER — INSULIN NPH ISOPHANE & REGULAR (70-30) 100 UNIT/ML ~~LOC~~ SUSP: 25.0000 [IU] | Freq: Two times a day (BID) | SUBCUTANEOUS | 99 refills | Status: DC

## 2023-05-12 NOTE — Progress Notes (Signed)
Established Patient Office Visit  Subjective:  Patient ID: Russell Greene, male    DOB: 1961/05/29  Age: 62 y.o. MRN: [id number]  Chief Complaint  Patient presents with   Follow-up    6 mo    Patient comes in for a follow-up today.  He missed his previous appointments because he was in [place] to help take care of her mother who is in poor health.  He ran out of medications and needs all his refills today. He does not have health insurance yet but trying to get it.  Did not get eye exam yet. Will check labs next visit, since he has not been taking his medications as prescribed at this time. He was seen by podiatrist today as he is having tingling and numbness due to diabetic neuropathy.  He is supposed to be on gabapentin but has not taken it for more than 2 months, will resume now.    No other concerns at this time.   Past Medical History:  Diagnosis Date   Diabetes mellitus without complication (HCC)     No past surgical history on file.  Social History   Socioeconomic History   Marital status: Married    Spouse name: Not on file   Number of children: Not on file   Years of education: Not on file   Highest education level: Not on file  Occupational History   Not on file  Tobacco Use   Smoking status: Every Day    Current packs/day: 0.00    Average packs/day: 0.3 packs/day for 20.0 years (5.0 ttl pk-yrs)    Types: Cigarettes    Start date: 09/30/1999    Last attempt to quit: 09/30/2019    Years since quitting: 3.6   Smokeless tobacco: Never  Vaping Use   Vaping status: Never Used  Substance and Sexual Activity   Alcohol use: Not Currently    Comment: no   Drug use: Never   Sexual activity: Not on file  Other Topics Concern   Not on file  Social History Narrative   Not on file   Social Determinants of Health   Financial Resource Strain: Not on file  Food Insecurity: Not on file  Transportation Needs: Not on file  Physical Activity: Not on file  Stress:  Not on file  Social Connections: Not on file  Intimate Partner Violence: Not on file    Family History  Problem Relation Age of Onset   Parkinson's disease Father     No Known Allergies  Review of Systems  Constitutional: Negative.  Negative for chills, fever and weight loss.  HENT: Negative.    Eyes: Negative.   Respiratory: Negative.  Negative for cough and shortness of breath.   Cardiovascular: Negative.  Negative for chest pain, palpitations and leg swelling.  Gastrointestinal: Negative.  Negative for abdominal pain, constipation, diarrhea, heartburn, nausea and vomiting.  Genitourinary: Negative.  Negative for dysuria and flank pain.  Musculoskeletal: Negative.  Negative for joint pain and myalgias.  Skin: Negative.   Neurological: Negative.  Negative for dizziness and headaches.  Endo/Heme/Allergies: Negative.   Psychiatric/Behavioral: Negative.  Negative for depression and suicidal ideas. The patient is not nervous/anxious.        Objective:   BP 130/80   Pulse 78   Ht 5\' 8"  (1.727 m)   Wt 187 lb 12.8 oz (85.2 kg)   SpO2 96%   BMI 28.55 kg/m   Vitals:   05/12/23 1155  BP: 130/80  Pulse: 78  Height: 5\' 8"  (1.727 m)  Weight: 187 lb 12.8 oz (85.2 kg)  SpO2: 96%  BMI (Calculated): 28.56    Physical Exam Vitals and nursing note reviewed.  Constitutional:      General: He is not in acute distress.    Appearance: Normal appearance.  HENT:     Head: Normocephalic and atraumatic.     Nose: Nose normal.     Mouth/Throat:     Mouth: Mucous membranes are moist.     Pharynx: Oropharynx is clear.  Eyes:     Conjunctiva/sclera: Conjunctivae normal.     Pupils: Pupils are equal, round, and reactive to light.  Cardiovascular:     Rate and Rhythm: Normal rate and regular rhythm.     Pulses: Normal pulses.     Heart sounds: Normal heart sounds.  Pulmonary:     Effort: Pulmonary effort is normal.     Breath sounds: Normal breath sounds. No wheezing, rhonchi or  rales.  Abdominal:     General: Bowel sounds are normal.     Palpations: Abdomen is soft. There is no mass.     Tenderness: There is no abdominal tenderness. There is no right CVA tenderness, left CVA tenderness, guarding or rebound.     Hernia: No hernia is present.  Musculoskeletal:        General: No swelling or deformity. Normal range of motion.     Cervical back: Normal range of motion.     Right lower leg: No edema.     Left lower leg: No edema.  Skin:    General: Skin is warm and dry.     Findings: No rash.  Neurological:     General: No focal deficit present.     Mental Status: He is alert and oriented to person, place, and time.  Psychiatric:        Mood and Affect: Mood normal.        Behavior: Behavior normal.        Judgment: Judgment normal.      Results for orders placed or performed in visit on 05/12/23  POCT Glucose (CBG)  Result Value Ref Range   POC Glucose 292 (A) 70 - 99 mg/dl    Recent Results (from the past 2160 hour(s))  POCT Glucose (CBG)     Status: Abnormal   Collection Time: 05/12/23 11:59 AM  Result Value Ref Range   POC Glucose 292 (A) 70 - 99 mg/dl      Assessment & Plan:   Problem List Items Addressed This Visit     Diabetes mellitus (HCC)   Relevant Medications   metFORMIN (GLUCOPHAGE) 1000 MG tablet   lisinopril (ZESTRIL) 10 MG tablet   dapagliflozin propanediol (FARXIGA) 10 MG TABS tablet   atorvastatin (LIPITOR) 40 MG tablet   insulin NPH-regular Human (70-30) 100 UNIT/ML injection   Other Relevant Orders   POCT Glucose (CBG) (Completed)   Essential hypertension, benign - Primary   Relevant Medications   lisinopril (ZESTRIL) 10 MG tablet   atorvastatin (LIPITOR) 40 MG tablet   Mixed hyperlipidemia   Relevant Medications   lisinopril (ZESTRIL) 10 MG tablet   atorvastatin (LIPITOR) 40 MG tablet   Diabetic polyneuropathy associated with diabetes mellitus due to underlying condition (HCC)   Relevant Medications   metFORMIN  (GLUCOPHAGE) 1000 MG tablet   lisinopril (ZESTRIL) 10 MG tablet   dapagliflozin propanediol (FARXIGA) 10 MG TABS tablet   gabapentin (NEURONTIN) 100 MG capsule   atorvastatin (  LIPITOR) 40 MG tablet   insulin NPH-regular Human (70-30) 100 UNIT/ML injection    Return in about 3 weeks (around 06/02/2023).   Total time spent: 30 minutes  Margaretann Loveless, MD  05/12/2023   This document may have been prepared by [facility] Voice Recognition software and as such may include unintentional dictation errors.

## 2023-05-12 NOTE — Progress Notes (Signed)
  Subjective:  Patient ID: Russell Greene, male    DOB: 11/27/1960,  MRN: [id number]  Chief Complaint  Patient presents with   Diabetes    "I'm Diabetic.  I can't feel my toes.  They feel like they are sleep."    Discussed the use of AI scribe software for clinical note transcription with the patient, who gave verbal consent to proceed.  History of Present Illness   The patient, with a history of diabetes, presents with numbness in his feet, which has been ongoing for the past six months. The numbness is described as being more pronounced in the left foot, affecting the entire foot and part of the right foot. The patient recently traveled to [place] due to family issues and ran out of his diabetes medication. He has an appointment later today to refill his prescriptions. The patient also reports a previous sensation of burning in his feet, which has since improved with the use of a cream given to him by his sister. The patient also has a history of a chainsaw injury to his foot two years ago, which healed without complications. He expresses concern about his blood sugar control and commits to taking his diabetes more seriously, acknowledging his diet high in tortillas as a potential contributing factor.          Objective:    Physical Exam   CARDIOVASCULAR: Pedal pulses palpable, plus two DPN, PT pulse on both feet. Varicosities on the right foot. NEUROLOGICAL: Decreased sensation to light touch and protective sensation in the midfoot. Diffuse polyneuropathy with loss of light touch and protective sensation to the midfoot. SKIN: Dry, scaly, moccasin-like rash observed bilaterally on the sole, no interdigital maceration, no open wounds or ulcers.       No images are attached to the encounter.    Results   A1c 10/2022 10.8%      Assessment:   1. 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 Tinea pedis of both feet      Plan:   Patient was evaluated and treated and all questions answered.  Assessment and Plan    Diabetic Peripheral Neuropathy   He experiences numbness in his feet without pain and has good circulation with strong pulses. We discussed the critical role of blood sugar control in preventing the worsening of neuropathy. We encouraged him to consistently take his diabetes medication and control his diet to lower his A1c. Additionally, we advised him to check his feet daily for any injuries or cuts.  Tinea Pedis (Athlete's Foot)   Physical examination revealed Tinea Pedis, which may be contributing to his foot discomfort. We will prescribe an antifungal cream to be applied twice daily on the bottom of the foot and a little bit between the toes.  Diabetes Mellitus   He ran out of medication while in [place] and has an appointment to get more medication. We discussed the importance of consistent medication use and diet control in managing diabetes and preventing complications. We encouraged him to attend his appointment to get medication and advised him to minimize intake of tortillas, focusing instead on vegetables and lean meats.          Return if symptoms worsen or fail to improve.

## 2023-06-03 ENCOUNTER — Ambulatory Visit: Payer: Self-pay | Admitting: Internal Medicine

## 2023-06-30 ENCOUNTER — Encounter: Payer: Self-pay | Admitting: Internal Medicine

## 2023-06-30 ENCOUNTER — Ambulatory Visit (INDEPENDENT_AMBULATORY_CARE_PROVIDER_SITE_OTHER): Payer: Self-pay | Admitting: Internal Medicine

## 2023-06-30 VITALS — BP 112/68 | HR 65 | Ht 68.0 in | Wt 188.8 lb

## 2023-06-30 DIAGNOSIS — I152 Hypertension secondary to endocrine disorders: Secondary | ICD-10-CM | POA: Insufficient documentation

## 2023-06-30 DIAGNOSIS — E1169 Type 2 diabetes mellitus with other specified complication: Secondary | ICD-10-CM | POA: Insufficient documentation

## 2023-06-30 DIAGNOSIS — E782 Mixed hyperlipidemia: Secondary | ICD-10-CM

## 2023-06-30 DIAGNOSIS — Z794 Long term (current) use of insulin: Secondary | ICD-10-CM

## 2023-06-30 DIAGNOSIS — E1159 Type 2 diabetes mellitus with other circulatory complications: Secondary | ICD-10-CM

## 2023-06-30 LAB — POCT CBG (FASTING - GLUCOSE)-MANUAL ENTRY: Glucose Fasting, POC: 129 mg/dL — AB (ref 70–99)

## 2023-06-30 NOTE — Progress Notes (Signed)
Established Patient Office Visit  Subjective:  Patient ID: Russell Greene, male    DOB: 07-23-1960  Age: 62 y.o. MRN: [id number]  Chief Complaint  Patient presents with   Follow-up    3 week follow up    Patient comes in for his follow-up today.  He has now resumed all his medications and his blood pressure is looking much better.  He is also feeling more energetic.  He is trying to get health insurance so that he can get other preventatives.  Today he is fasting for blood work. No new complaints.    No other concerns at this time.   Past Medical History:  Diagnosis Date   Diabetes mellitus without complication (HCC)     History reviewed. No pertinent surgical history.  Social History   Socioeconomic History   Marital status: Married    Spouse name: Not on file   Number of children: Not on file   Years of education: Not on file   Highest education level: Not on file  Occupational History   Not on file  Tobacco Use   Smoking status: Every Day    Current packs/day: 0.00    Average packs/day: 0.3 packs/day for 20.0 years (5.0 ttl pk-yrs)    Types: Cigarettes    Start date: 09/30/1999    Last attempt to quit: 09/30/2019    Years since quitting: 3.7   Smokeless tobacco: Never  Vaping Use   Vaping status: Never Used  Substance and Sexual Activity   Alcohol use: Not Currently    Comment: no   Drug use: Never   Sexual activity: Not on file  Other Topics Concern   Not on file  Social History Narrative   Not on file   Social Drivers of Health   Financial Resource Strain: Not on file  Food Insecurity: Not on file  Transportation Needs: Not on file  Physical Activity: Not on file  Stress: Not on file  Social Connections: Not on file  Intimate Partner Violence: Not on file    Family History  Problem Relation Age of Onset   Parkinson's disease Father     No Known Allergies  Outpatient Medications Prior to Visit  Medication Sig   atorvastatin (LIPITOR) 40  MG tablet Take 1 tablet (40 mg total) by mouth daily.   dapagliflozin propanediol (FARXIGA) 10 MG TABS tablet Take 1 tablet (10 mg total) by mouth daily before breakfast.   gabapentin (NEURONTIN) 100 MG capsule Take 1 capsule (100 mg total) by mouth at bedtime.   insulin NPH-regular Human (70-30) 100 UNIT/ML injection Inject 25 Units into the skin 2 (two) times daily with a meal.   lisinopril (ZESTRIL) 10 MG tablet Take 1 tablet (10 mg total) by mouth daily.   metFORMIN (GLUCOPHAGE) 1000 MG tablet Take 1 tablet (1,000 mg total) by mouth 2 (two) times daily with a meal.   No facility-administered medications prior to visit.    Review of Systems  Constitutional: Negative.  Negative for chills, fever, malaise/fatigue and weight loss.  HENT: Negative.  Negative for hearing loss and sore throat.   Eyes: Negative.  Negative for blurred vision.  Respiratory: Negative.  Negative for cough and shortness of breath.   Cardiovascular: Negative.  Negative for chest pain, palpitations and leg swelling.  Gastrointestinal: Negative.  Negative for abdominal pain, blood in stool, constipation, diarrhea, heartburn, nausea and vomiting.  Genitourinary:  Negative for dysuria and flank pain.  Musculoskeletal: Negative.  Negative for joint  pain and myalgias.  Skin: Negative.   Neurological: Negative.  Negative for dizziness, tingling, tremors, sensory change, speech change and headaches.  Endo/Heme/Allergies: Negative.   Psychiatric/Behavioral: Negative.  Negative for depression and suicidal ideas. The patient is not nervous/anxious.        Objective:   BP 112/68   Pulse 65   Ht 5\' 8"  (1.727 m)   Wt 188 lb 12.8 oz (85.6 kg)   SpO2 94%   BMI 28.71 kg/m   Vitals:   06/30/23 0932  BP: 112/68  Pulse: 65  Height: 5\' 8"  (1.727 m)  Weight: 188 lb 12.8 oz (85.6 kg)  SpO2: 94%  BMI (Calculated): 28.71    Physical Exam Vitals and nursing note reviewed.  Constitutional:      Appearance: Normal  appearance.  HENT:     Head: Normocephalic and atraumatic.     Nose: Nose normal.     Mouth/Throat:     Mouth: Mucous membranes are moist.     Pharynx: Oropharynx is clear.  Eyes:     Conjunctiva/sclera: Conjunctivae normal.     Pupils: Pupils are equal, round, and reactive to light.  Cardiovascular:     Rate and Rhythm: Normal rate and regular rhythm.     Pulses: Normal pulses.     Heart sounds: Normal heart sounds.  Pulmonary:     Effort: Pulmonary effort is normal.     Breath sounds: Normal breath sounds.  Abdominal:     General: Bowel sounds are normal.     Palpations: Abdomen is soft.  Musculoskeletal:        General: Normal range of motion.     Cervical back: Normal range of motion.  Skin:    General: Skin is warm and dry.  Neurological:     General: No focal deficit present.     Mental Status: He is alert and oriented to person, place, and time.  Psychiatric:        Mood and Affect: Mood normal.        Behavior: Behavior normal.        Judgment: Judgment normal.      Results for orders placed or performed in visit on 06/30/23  POCT CBG (Fasting - Glucose)  Result Value Ref Range   Glucose Fasting, POC 129 (A) 70 - 99 mg/dL    Recent Results (from the past 2160 hours)  POCT Glucose (CBG)     Status: Abnormal   Collection Time: 05/12/23 11:59 AM  Result Value Ref Range   POC Glucose 292 (A) 70 - 99 mg/dl  POCT CBG (Fasting - Glucose)     Status: Abnormal   Collection Time: 06/30/23  9:37 AM  Result Value Ref Range   Glucose Fasting, POC 129 (A) 70 - 99 mg/dL      Assessment & Plan:  Patient advised to continue taking all his medications regularly along with diet control and exercise. Check his fasting labs today. Problem List Items Addressed This Visit     Diabetes mellitus (HCC)   Relevant Orders   POCT CBG (Fasting - Glucose) (Completed)   CMP14+EGFR   Hemoglobin A1c   Combined hyperlipidemia associated with type 2 diabetes mellitus (HCC)    Relevant Orders   Lipid Panel w/o Chol/HDL Ratio   Hypertension associated with diabetes (HCC) - Primary   Relevant Orders   CMP14+EGFR    Return in about 3 months (around 09/28/2023).   Total time spent: 25 minutes  Margaretann Loveless, MD  06/30/2023   This document may have been prepared by [employer] Voice Recognition software and as such may include unintentional dictation errors.

## 2023-07-01 LAB — CMP14+EGFR
ALT: 25 [IU]/L (ref 0–44)
AST: 24 [IU]/L (ref 0–40)
Albumin: 4.1 g/dL (ref 3.8–4.9)
Alkaline Phosphatase: 75 [IU]/L (ref 44–121)
BUN/Creatinine Ratio: 23 (ref 10–24)
BUN: 19 mg/dL (ref 8–27)
Bilirubin Total: 0.5 mg/dL (ref 0.0–1.2)
CO2: 21 mmol/L (ref 20–29)
Calcium: 9.1 mg/dL (ref 8.6–10.2)
Chloride: 105 mmol/L (ref 96–106)
Creatinine, Ser: 0.84 mg/dL (ref 0.76–1.27)
Globulin, Total: 2.1 g/dL (ref 1.5–4.5)
Glucose: 119 mg/dL — ABNORMAL HIGH (ref 70–99)
Potassium: 4.4 mmol/L (ref 3.5–5.2)
Sodium: 140 mmol/L (ref 134–144)
Total Protein: 6.2 g/dL (ref 6.0–8.5)
eGFR: 100 mL/min/{1.73_m2} (ref >59)

## 2023-07-01 LAB — HEMOGLOBIN A1C
Est. average glucose Bld gHb Est-mCnc: 240 mg/dL
Hgb A1c MFr Bld: 10 % — ABNORMAL HIGH (ref 4.8–5.6)

## 2023-07-01 LAB — LIPID PANEL W/O CHOL/HDL RATIO
Cholesterol, Total: 110 mg/dL (ref 100–199)
HDL: 31 mg/dL — ABNORMAL LOW (ref >39)
LDL Chol Calc (NIH): 60 mg/dL (ref 0–99)
Triglycerides: 99 mg/dL (ref 0–149)
VLDL Cholesterol Cal: 19 mg/dL (ref 5–40)

## 2023-07-23 ENCOUNTER — Encounter: Payer: Self-pay | Admitting: Emergency Medicine

## 2023-07-23 ENCOUNTER — Other Ambulatory Visit: Payer: Self-pay

## 2023-07-23 ENCOUNTER — Ambulatory Visit
Admission: EM | Admit: 2023-07-23 | Discharge: 2023-07-23 | Disposition: A | Discharge status: Home / Self Care | Payer: Self-pay | Attending: Nurse Practitioner | Admitting: Nurse Practitioner

## 2023-07-23 ENCOUNTER — Ambulatory Visit (INDEPENDENT_AMBULATORY_CARE_PROVIDER_SITE_OTHER): Payer: Self-pay

## 2023-07-23 DIAGNOSIS — M25572 Pain in left ankle and joints of left foot: Secondary | ICD-10-CM

## 2023-07-23 DIAGNOSIS — M25562 Pain in left knee: Secondary | ICD-10-CM

## 2023-07-23 NOTE — Discharge Instructions (Addendum)
X-rays are negative today for broken bones of your knee or ankle.  The x-rays do show fluid on your knee and ankle.  This is likely coming from the trauma with the log.  Please wear the Ace wrap to help provide support and compression to the joints.  You can take Tylenol 500 to 1000 mg every 6 hours as needed for pain.  Seek care if symptoms do not improve with treatment.

## 2023-07-23 NOTE — ED Triage Notes (Addendum)
Pt reports left knee and ankle pain x3 days after splitting wood. pt reports "large piece" rolled on my leg and "I twisted to get it off of my leg" states pain and swelling ever since. Pt able to bear weight but noted to have minimal limp.

## 2023-07-23 NOTE — ED Provider Notes (Signed)
[facility]    CSN: [id number] Arrival date & time: 07/23/23  1612      History   Chief Complaint Chief Complaint  Patient presents with   Leg Pain    HPI Russell Greene is a 63 y.o. male.   Patient presents today for 3-day history of left knee and ankle pain and swelling that began after splitting wood and a piece of wood falling on his left leg.  Reports he was able to maneuver his body and use his other leg to push the log off of his left leg and was able to walk immediately after.  Reports it was painful to walk and has been painful to weight-bear ever since the injury.  He notices swelling shortly after the injury.  No numbness or tingling in the toes or skin color changes.  Has not taken anything for pain so far.    Past Medical History:  Diagnosis Date   Diabetes mellitus without complication 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 No pertinent surgical history.     Home Medications    Prior to Admission medications   Medication Sig Start Date End Date Taking? Authorizing Provider  atorvastatin (LIPITOR) 40 MG tablet Take 1 tablet (40 mg total) by mouth daily. 05/12/23   Margaretann Loveless, MD  dapagliflozin propanediol (FARXIGA) 10 MG TABS tablet Take 1 tablet (10 mg total) by mouth daily before breakfast. 05/12/23   Margaretann Loveless, MD  gabapentin (NEURONTIN) 100 MG capsule Take 1 capsule (100 mg total) by mouth at bedtime. 05/12/23  12/08/23  Margaretann Loveless, MD  insulin NPH-regular Human (70-30) 100 UNIT/ML injection Inject 25 Units into the skin 2 (two) times daily with a meal. 05/12/23   Margaretann Loveless, MD  lisinopril (ZESTRIL) 10 MG tablet Take 1 tablet (10 mg total) by mouth daily. 05/12/23 05/11/24  Margaretann Loveless, MD  metFORMIN (GLUCOPHAGE) 1000 MG tablet Take 1 tablet (1,000 mg total) by mouth 2 (two) times daily with a meal. 05/12/23   Margaretann Loveless, MD    Family History Family History  Problem Relation Age of Onset   Parkinson's disease Father     Social History Social History   Tobacco Use   Smoking status: Every Day    Current packs/day: 0.00    Average packs/day: 0.3 packs/day for 20.0 years (5.0 ttl pk-yrs)    Types: Cigarettes    Start date: 09/30/1999    Last attempt to quit: 09/30/2019    Years since quitting: 3.8   Smokeless tobacco: Never  Vaping Use   Vaping status: Never Used  Substance Use Topics   Alcohol use: Not Currently    Comment: no   Drug use: Never     Allergies   Patient has no known allergies.   Review of Systems Review of Systems Per HPI  Physical Exam Triage Vital Signs ED Triage Vitals  Encounter Vitals Group     BP 07/23/23 1652 (!) 149/85  Systolic BP Percentile --      Diastolic BP Percentile --      Pulse Rate 07/23/23 1652 75     Resp 07/23/23 1652 20     Temp 07/23/23 1652 98 F (36.7 C)     Temp Source 07/23/23 1652 Oral     SpO2 07/23/23 1652 97 %     Weight --      Height --      Head Circumference --      Peak Flow --      Pain Score 07/23/23 1651 9     Pain Loc --      Pain Education --      Exclude from Growth Chart --    No data found.  Updated Vital Signs BP (!) 149/85 (BP Location: Right Arm)   Pulse 75   Temp 98 F (36.7 C) (Oral)   Resp 20   SpO2 97%   Visual Acuity Right Eye Distance:   Left Eye Distance:   Bilateral Distance:    Right Eye Near:   Left Eye Near:    Bilateral Near:     Physical Exam Vitals and  nursing note reviewed.  Constitutional:      General: He is not in acute distress.    Appearance: Normal appearance. He is not toxic-appearing.  Pulmonary:     Effort: Pulmonary effort is normal. No respiratory distress.  Musculoskeletal:     Comments: Inspection: Mild effusion noted to left knee, left lateral ankle; no bruising, obvious deformity or redness Palpation: tender to palpation left medial knee joint and left lateral ankle; no obvious deformities palpated ROM: Full ROM to left knee without laxity, left ankle and flexibility of foot Strength: 5/5 bilateral lower extremities Neurovascular: neurovascularly intact in distal bilateral lower extremities  Skin:    General: Skin is warm and dry.     Capillary Refill: Capillary refill takes less than 2 seconds.     Coloration: Skin is not jaundiced or pale.     Findings: No erythema.  Neurological:     Mental Status: He is alert and oriented to person, place, and time.  Psychiatric:        Behavior: Behavior is cooperative.      UC Treatments / Results  Labs (all labs ordered are listed, but only abnormal results are displayed) Labs Reviewed - No data to display  EKG   Radiology DG Ankle Complete Left Result Date: 07/23/2023 CLINICAL DATA:  Left ankle pain, log rolled on leg 3 days ago EXAM: LEFT ANKLE COMPLETE - 3+ VIEW COMPARISON:  None Available. FINDINGS: Frontal, oblique, lateral views of the left ankle are obtained. No acute fracture, subluxation, or dislocation. Joint spaces are well preserved. Mild lateral soft tissue swelling. IMPRESSION: 1. Lateral soft tissue swelling.  No acute displaced fracture. Electronically Signed   By: Sharlet Salina M.D.   On: 07/23/2023 17:40   DG Knee Complete 4 Views Left Result Date: 07/23/2023 CLINICAL DATA:  Knee pain after fall EXAM: LEFT KNEE - COMPLETE 4+ VIEW COMPARISON:  None Available. FINDINGS: No fracture or malalignment. Small knee effusion. Minimal degenerative change  IMPRESSION: No acute osseous abnormality. Small knee effusion. Electronically Signed   By: Jasmine Pang M.D.   On: 07/23/2023 17:39    Procedures Procedures (including critical care time)  Medications Ordered in UC Medications - No data to display  Initial Impression / Assessment and Plan / UC Course  I have reviewed the triage vital signs  and the nursing notes.  Pertinent labs & imaging results that were available during my care of the patient were reviewed by me and considered in my medical decision making (see chart for details).   Patient is well-appearing, normotensive, afebrile, not tachycardic, not tachypneic, oxygenating well on room air.    1. Acute pain of left knee 2. Acute left ankle pain X-ray of knee and ankle are negative today for acute bony abnormalities There are effusions which are likely due to the injury No red flags on exam, patient has full sensation and movement; he is distally neurovascularly intact Supportive care discussed and recommended Ace wrap's, Tylenol/ibuprofen for pain, and rest Return and ER precautions discussed  The patient was given the opportunity to ask questions.  All questions answered to their satisfaction.  The patient is in agreement to this plan.    Final Clinical Impressions(s) / UC Diagnoses   Final diagnoses:  Acute pain of left knee  Acute left ankle pain     Discharge Instructions      X-rays are negative today for broken bones of your knee or ankle.  The x-rays do show fluid on your knee and ankle.  This is likely coming from the trauma with the log.  Please wear the Ace wrap to help provide support and compression to the joints.  You can take Tylenol 500 to 1000 mg every 6 hours as needed for pain.  Seek care if symptoms do not improve with treatment.    ED Prescriptions   None    PDMP not reviewed this encounter.   Valentino Nose, NP 07/23/23 [phone]

## 2023-09-26 ENCOUNTER — Ambulatory Visit: Payer: Self-pay | Admitting: Internal Medicine

## 2023-11-13 ENCOUNTER — Encounter: Payer: Self-pay | Admitting: Internal Medicine

## 2023-11-13 ENCOUNTER — Ambulatory Visit: Payer: Self-pay | Admitting: Internal Medicine

## 2023-11-13 VITALS — BP 126/72 | HR 76 | Ht 68.0 in | Wt 188.8 lb

## 2023-11-13 DIAGNOSIS — I152 Hypertension secondary to endocrine disorders: Secondary | ICD-10-CM

## 2023-11-13 DIAGNOSIS — E1159 Type 2 diabetes mellitus with other circulatory complications: Secondary | ICD-10-CM

## 2023-11-13 DIAGNOSIS — E1169 Type 2 diabetes mellitus with other specified complication: Secondary | ICD-10-CM

## 2023-11-13 DIAGNOSIS — I10 Essential (primary) hypertension: Secondary | ICD-10-CM

## 2023-11-13 DIAGNOSIS — N486 Induration penis plastica: Secondary | ICD-10-CM

## 2023-11-13 DIAGNOSIS — E0842 Diabetes mellitus due to underlying condition with diabetic polyneuropathy: Secondary | ICD-10-CM

## 2023-11-13 DIAGNOSIS — E1165 Type 2 diabetes mellitus with hyperglycemia: Secondary | ICD-10-CM

## 2023-11-13 DIAGNOSIS — Z794 Long term (current) use of insulin: Secondary | ICD-10-CM

## 2023-11-13 DIAGNOSIS — E782 Mixed hyperlipidemia: Secondary | ICD-10-CM

## 2023-11-13 DIAGNOSIS — Z125 Encounter for screening for malignant neoplasm of prostate: Secondary | ICD-10-CM

## 2023-11-13 LAB — POCT CBG (FASTING - GLUCOSE)-MANUAL ENTRY: Glucose Fasting, POC: 166 mg/dL — AB (ref 70–99)

## 2023-11-13 NOTE — Progress Notes (Signed)
 Established Patient Office Visit  Subjective:  Patient ID: Russell Greene, male    DOB: 11-May-1961  Age: 63 y.o. MRN: [id number]  Chief Complaint  Patient presents with   Follow-up    Patient comes in for his follow-up.  [place].  Generally feels well and has no new complaints.  Reports of running out of some of the medications but he had refills which we will pick up today.  He is trying to get health insurance and wants to wait on the preventatives.  However he requests a urology referral, as describes a condition suggestive of Peyronie's disease. Denies chest pain, no shortness of breath, no palpitations.  He does feel mild numbness of his toes bilaterally.    No other concerns at this time.   Past Medical History:  Diagnosis Date   Diabetes mellitus without complication (HCC)     History reviewed. No pertinent surgical history.  Social History   Socioeconomic History   Marital status: Married    Spouse name: Not on file   Number of children: Not on file   Years of education: Not on file   Highest education level: Not on file  Occupational History   Not on file  Tobacco Use   Smoking status: Every Day    Current packs/day: 0.00    Average packs/day: 0.3 packs/day for 20.0 years (5.0 ttl pk-yrs)    Types: Cigarettes    Start date: 09/30/1999    Last attempt to quit: 09/30/2019    Years since quitting: 4.1   Smokeless tobacco: Never  Vaping Use   Vaping status: Never Used  Substance and Sexual Activity   Alcohol use: Not Currently    Comment: no   Drug use: Never   Sexual activity: Not on file  Other Topics Concern   Not on file  Social History Narrative   Not on file   Social Drivers of Health   Financial Resource Strain: Not on file  Food Insecurity: Not on file  Transportation Needs: Not on file  Physical Activity: Not on file  Stress: Not on file  Social Connections: Not on file  Intimate  Partner Violence: Not on file    Family History  Problem Relation Age of Onset   Parkinson's disease Father     No Known Allergies  Outpatient Medications Prior to Visit  Medication Sig   atorvastatin  (LIPITOR) 40 MG tablet Take 1 tablet (40 mg total) by mouth daily.   dapagliflozin  propanediol (FARXIGA ) 10 MG TABS tablet Take 1 tablet (10 mg total) by mouth daily before breakfast.   gabapentin  (NEURONTIN ) 100 MG capsule Take 1 capsule (100 mg total) by mouth at bedtime.   insulin  NPH-regular Human (70-30) 100 UNIT/ML injection Inject 25 Units into the skin 2 (two) times daily with a meal.   lisinopril  (ZESTRIL ) 10 MG tablet Take 1 tablet (10 mg total) by mouth daily.   metFORMIN  (GLUCOPHAGE ) 1000 MG tablet Take 1 tablet (1,000 mg total) by mouth 2 (two) times daily with a meal.   No facility-administered medications prior to visit.    Review of Systems  Constitutional: Negative.  Negative for chills, fever and weight loss.  HENT: Negative.  Negative for ear discharge, sinus pain and sore throat.   Eyes: Negative.   Respiratory: Negative.  Negative for cough and shortness of breath.   Cardiovascular: Negative.  Negative for chest pain, palpitations and leg swelling.  Gastrointestinal: Negative.  Negative for abdominal pain, constipation, diarrhea, heartburn, nausea and vomiting.  Genitourinary: Negative.  Negative for dysuria and flank pain.  Musculoskeletal: Negative.  Negative for joint pain and myalgias.  Skin: Negative.   Neurological:  Positive for sensory change. Negative for dizziness and headaches.  Endo/Heme/Allergies: Negative.   Psychiatric/Behavioral: Negative.  Negative for depression and suicidal ideas. The patient is not nervous/anxious.        Objective:   BP 126/72   Pulse 76   Ht 5\' 8"  (1.727 m)   Wt 188 lb 12.8 oz (85.6 kg)   SpO2 96%   BMI 28.71 kg/m   Vitals:   11/13/23 0933  BP: 126/72  Pulse: 76  Height: 5\' 8"  (1.727 m)  Weight: 188 lb 12.8  oz (85.6 kg)  SpO2: 96%  BMI (Calculated): 28.71    Physical Exam Vitals and nursing note reviewed.  Constitutional:      Appearance: Normal appearance.  HENT:     Head: Normocephalic and atraumatic.     Nose: Nose normal.     Mouth/Throat:     Mouth: Mucous membranes are moist.     Pharynx: Oropharynx is clear.  Eyes:     Conjunctiva/sclera: Conjunctivae normal.     Pupils: Pupils are equal, round, and reactive to light.  Cardiovascular:     Rate and Rhythm: Normal rate and regular rhythm.     Pulses: Normal pulses.     Heart sounds: Normal heart sounds.  Pulmonary:     Effort: Pulmonary effort is normal.     Breath sounds: Normal breath sounds.  Abdominal:     General: Bowel sounds are normal.     Palpations: Abdomen is soft.  Musculoskeletal:        General: Normal range of motion.     Cervical back: Normal range of motion.  Skin:    General: Skin is warm and dry.  Neurological:     General: No focal deficit present.     Mental Status: He is alert and oriented to person, place, and time.  Psychiatric:        Mood and Affect: Mood normal.        Behavior: Behavior normal.        Judgment: Judgment normal.      Results for orders placed or performed in visit on 11/13/23  POCT CBG (Fasting - Glucose)  Result Value Ref Range   Glucose Fasting, POC 166 (A) 70 - 99 mg/dL    Recent Results (from the past 2160 hours)  POCT CBG (Fasting - Glucose)     Status: Abnormal   Collection Time: 11/13/23  9:40 AM  Result Value Ref Range   Glucose Fasting, POC 166 (A) 70 - 99 mg/dL      Assessment & Plan:  Sinew current medications.  Check blood work today.  Urology referral.  Follow-up to discuss labs and adjust medications further. Problem List Items Addressed This Visit     Diabetes mellitus (HCC)   Relevant Orders   POCT CBG (Fasting - Glucose) (Completed)   CMP14+EGFR   Hemoglobin A1c   Essential hypertension, benign   Mixed hyperlipidemia   Diabetic  polyneuropathy associated with diabetes mellitus due to underlying condition (HCC)   Combined hyperlipidemia associated with type 2 diabetes mellitus (HCC)   Relevant Orders   Lipid Panel w/o Chol/HDL Ratio   Hypertension associated with diabetes (HCC) - Primary   Relevant Orders   CMP14+EGFR   Other Visit Diagnoses  Screening for prostate cancer       Relevant Orders   PSA     Peyronie disease       Relevant Orders   Ambulatory referral to Urology       Return in about 10 days (around 11/23/2023).   Total time spent: 30 minutes  Aisha Hove, MD  11/13/2023   This document may have been prepared by [facility] Voice Recognition software and as such may include unintentional dictation errors.

## 2023-11-14 LAB — CMP14+EGFR
ALT: 31 IU/L (ref 0–44)
AST: 23 IU/L (ref 0–40)
Albumin: 4.1 g/dL (ref 3.8–4.9)
Alkaline Phosphatase: 78 IU/L (ref 44–121)
BUN/Creatinine Ratio: 17 (ref 10–24)
BUN: 13 mg/dL (ref 8–27)
Bilirubin Total: 0.7 mg/dL (ref 0.0–1.2)
CO2: 21 mmol/L (ref 20–29)
Calcium: 9.1 mg/dL (ref 8.6–10.2)
Chloride: 100 mmol/L (ref 96–106)
Creatinine, Ser: 0.77 mg/dL (ref 0.76–1.27)
Globulin, Total: 2.1 g/dL (ref 1.5–4.5)
Glucose: 203 mg/dL — ABNORMAL HIGH (ref 70–99)
Potassium: 4.7 mmol/L (ref 3.5–5.2)
Sodium: 135 mmol/L (ref 134–144)
Total Protein: 6.2 g/dL (ref 6.0–8.5)
eGFR: 102 mL/min/{1.73_m2} (ref >59)

## 2023-11-14 LAB — LIPID PANEL W/O CHOL/HDL RATIO
Cholesterol, Total: 137 mg/dL (ref 100–199)
HDL: 30 mg/dL — ABNORMAL LOW (ref >39)
LDL Chol Calc (NIH): 80 mg/dL (ref 0–99)
Triglycerides: 155 mg/dL — ABNORMAL HIGH (ref 0–149)
VLDL Cholesterol Cal: 27 mg/dL (ref 5–40)

## 2023-11-14 LAB — HEMOGLOBIN A1C
Est. average glucose Bld gHb Est-mCnc: 240 mg/dL
Hgb A1c MFr Bld: 10 % — ABNORMAL HIGH (ref 4.8–5.6)

## 2023-11-14 LAB — PSA: Prostate Specific Ag, Serum: 1.6 ng/mL (ref 0.0–4.0)

## 2023-11-14 NOTE — Progress Notes (Signed)
 Patient notified

## 2023-11-18 ENCOUNTER — Ambulatory Visit: Payer: Self-pay | Admitting: Cardiovascular Disease

## 2023-11-24 ENCOUNTER — Ambulatory Visit: Payer: Self-pay | Admitting: Internal Medicine

## 2023-11-27 ENCOUNTER — Ambulatory Visit: Payer: Self-pay | Admitting: Internal Medicine

## 2023-11-27 ENCOUNTER — Encounter: Payer: Self-pay | Admitting: Internal Medicine

## 2023-11-27 VITALS — BP 116/70 | HR 73 | Ht 68.0 in | Wt 190.2 lb

## 2023-11-27 DIAGNOSIS — E782 Mixed hyperlipidemia: Secondary | ICD-10-CM

## 2023-11-27 DIAGNOSIS — E1169 Type 2 diabetes mellitus with other specified complication: Secondary | ICD-10-CM

## 2023-11-27 DIAGNOSIS — E1142 Type 2 diabetes mellitus with diabetic polyneuropathy: Secondary | ICD-10-CM

## 2023-11-27 DIAGNOSIS — E1165 Type 2 diabetes mellitus with hyperglycemia: Secondary | ICD-10-CM

## 2023-11-27 DIAGNOSIS — E1159 Type 2 diabetes mellitus with other circulatory complications: Secondary | ICD-10-CM

## 2023-11-27 DIAGNOSIS — I152 Hypertension secondary to endocrine disorders: Secondary | ICD-10-CM

## 2023-11-27 LAB — POCT CBG (FASTING - GLUCOSE)-MANUAL ENTRY: Glucose Fasting, POC: 200 mg/dL — AB (ref 70–99)

## 2023-11-27 MED ORDER — INSULIN NPH ISOPHANE & REGULAR (70-30) 100 UNIT/ML ~~LOC~~ SUSP: 40.0000 [IU] | Freq: Two times a day (BID) | SUBCUTANEOUS | 6 refills | Status: AC

## 2023-11-27 MED ORDER — DAPAGLIFLOZIN PROPANEDIOL 10 MG PO TABS: 10.0000 mg | ORAL_TABLET | Freq: Every day | ORAL | 6 refills | Status: AC

## 2023-11-27 NOTE — Progress Notes (Signed)
 Established Patient Office Visit  Subjective:  Patient ID: Russell Greene, male    DOB: [birth year]  Age: 63 y.o. MRN: [id number]  Chief Complaint  Patient presents with   Follow-up    10 day follow up    Patient comes in to discuss his blood work.  His hemoglobin A1c is still high at 10.0.  Patient is currently on Farxiga  and metformin , along with insulin  70/30 mix at 30 units twice a day.  He was previously on 25 units twice a day but has been advised recently to increase the dose.  His fingerstick glucose is still very high today.  Patient admits to dietary noncompliance, eats a lot of fruit and carbs.  He has been instructed repeatedly to be strict with his diet.  Today he agrees that he will start following a strict diabetic diet.  Will increase his insulin  7030 to 40 units twice a day.  Patient is advised to bring his daughter next time to further emphasize the need for strict dietary control.    No other concerns at this time.   Past Medical History:  Diagnosis Date   Diabetes mellitus without complication (HCC)     History reviewed. No pertinent surgical history.  Social History   Socioeconomic History   Marital status: Married    Spouse name: Not on file   Number of children: Not on file   Years of education: Not on file   Highest education level: Not on file  Occupational History   Not on file  Tobacco Use   Smoking status: Every Day    Current packs/day: 0.00    Average packs/day: 0.3 packs/day for 20.0 years (5.0 ttl pk-yrs)    Types: Cigarettes    Start date: 09/30/1999    Last attempt to quit: 09/30/2019    Years since quitting: 4.1   Smokeless tobacco: Never  Vaping Use   Vaping status: Never Used  Substance and Sexual Activity   Alcohol use: Not Currently    Comment: no   Drug use: Never   Sexual activity: Not on file  Other Topics Concern   Not on file  Social History Narrative   Not on file   Social Drivers of Health   Financial Resource  Strain: Not on file  Food Insecurity: Not on file  Transportation Needs: Not on file  Physical Activity: Not on file  Stress: Not on file  Social Connections: Not on file  Intimate Partner Violence: Not on file    Family History  Problem Relation Age of Onset   Parkinson's disease Father     No Known Allergies  Outpatient Medications Prior to Visit  Medication Sig   atorvastatin  (LIPITOR) 40 MG tablet Take 1 tablet (40 mg total) by mouth daily.   gabapentin  (NEURONTIN ) 100 MG capsule Take 1 capsule (100 mg total) by mouth at bedtime.   lisinopril  (ZESTRIL ) 10 MG tablet Take 1 tablet (10 mg total) by mouth daily.   metFORMIN  (GLUCOPHAGE ) 1000 MG tablet Take 1 tablet (1,000 mg total) by mouth 2 (two) times daily with a meal.   [DISCONTINUED] dapagliflozin  propanediol (FARXIGA ) 10 MG TABS tablet Take 1 tablet (10 mg total) by mouth daily before breakfast.   [DISCONTINUED] insulin  NPH-regular Human (70-30) 100 UNIT/ML injection Inject 25 Units into the skin 2 (two) times daily with a meal.   No facility-administered medications prior to visit.    Review of Systems  Constitutional: Negative.  Negative for chills, fever, malaise/fatigue and  weight loss.  HENT: Negative.  Negative for ear discharge and sore throat.   Eyes: Negative.   Respiratory: Negative.  Negative for cough and shortness of breath.   Cardiovascular: Negative.  Negative for chest pain, palpitations and leg swelling.  Gastrointestinal: Negative.  Negative for abdominal pain, constipation, diarrhea, heartburn, nausea and vomiting.  Genitourinary: Negative.  Negative for dysuria and flank pain.  Musculoskeletal: Negative.  Negative for joint pain and myalgias.  Skin: Negative.   Neurological: Negative.  Negative for dizziness, tingling, tremors and headaches.  Endo/Heme/Allergies: Negative.   Psychiatric/Behavioral: Negative.  Negative for depression and suicidal ideas. The patient is not nervous/anxious.         Objective:   BP 116/70   Pulse 73   Ht 5\' 8"  (1.727 m)   Wt 190 lb 3.2 oz (86.3 kg)   SpO2 97%   BMI 28.92 kg/m   Vitals:   11/27/23 1048  BP: 116/70  Pulse: 73  Height: 5\' 8"  (1.727 m)  Weight: 190 lb 3.2 oz (86.3 kg)  SpO2: 97%  BMI (Calculated): 28.93    Physical Exam Vitals and nursing note reviewed.  Constitutional:      Appearance: Normal appearance.  HENT:     Head: Normocephalic and atraumatic.     Nose: Nose normal.     Mouth/Throat:     Mouth: Mucous membranes are moist.     Pharynx: Oropharynx is clear.  Eyes:     Conjunctiva/sclera: Conjunctivae normal.     Pupils: Pupils are equal, round, and reactive to light.  Cardiovascular:     Rate and Rhythm: Normal rate and regular rhythm.     Pulses: Normal pulses.     Heart sounds: Normal heart sounds.  Pulmonary:     Effort: Pulmonary effort is normal.     Breath sounds: Normal breath sounds.  Abdominal:     General: Bowel sounds are normal.     Palpations: Abdomen is soft.  Musculoskeletal:        General: Normal range of motion.     Cervical back: Normal range of motion.  Skin:    General: Skin is warm and dry.  Neurological:     General: No focal deficit present.     Mental Status: He is alert and oriented to person, place, and time.  Psychiatric:        Mood and Affect: Mood normal.        Behavior: Behavior normal.        Judgment: Judgment normal.      Results for orders placed or performed in visit on 11/27/23  POCT CBG (Fasting - Glucose)  Result Value Ref Range   Glucose Fasting, POC 200 (A) 70 - 99 mg/dL    Recent Results (from the past 2160 hours)  POCT CBG (Fasting - Glucose)     Status: Abnormal   Collection Time: 11/13/23  9:40 AM  Result Value Ref Range   Glucose Fasting, POC 166 (A) 70 - 99 mg/dL  [id number]     Status: Abnormal   Collection Time: 11/13/23 10:11 AM  Result Value Ref Range   Glucose 203 (H) 70 - 99 mg/dL   BUN 13 8 - 27 mg/dL   Creatinine, Ser 4.09  0.76 - 1.27 mg/dL   eGFR 811 >91 YN/WGN/5.62   BUN/Creatinine Ratio 17 10 - 24   Sodium 135 134 - 144 mmol/L   Potassium 4.7 3.5 - 5.2 mmol/L   Chloride 100 96 - 106 mmol/L  CO2 21 20 - 29 mmol/L   Calcium  9.1 8.6 - 10.2 mg/dL   Total Protein 6.2 6.0 - 8.5 g/dL   Albumin 4.1 3.8 - 4.9 g/dL   Globulin, Total 2.1 1.5 - 4.5 g/dL   Bilirubin Total 0.7 0.0 - 1.2 mg/dL   Alkaline Phosphatase 78 44 - 121 IU/L   AST 23 0 - 40 IU/L   ALT 31 0 - 44 IU/L  Lipid Panel w/o Chol/HDL Ratio     Status: Abnormal   Collection Time: 11/13/23 10:11 AM  Result Value Ref Range   Cholesterol, Total 137 100 - 199 mg/dL   Triglycerides 161 (H) 0 - 149 mg/dL   HDL 30 (L) >09 mg/dL   VLDL Cholesterol Cal 27 5 - 40 mg/dL   LDL Chol Calc (NIH) 80 0 - 99 mg/dL  Hemoglobin U0A     Status: Abnormal   Collection Time: 11/13/23 10:11 AM  Result Value Ref Range   Hgb A1c MFr Bld 10.0 (H) 4.8 - 5.6 %    Comment:          Prediabetes: 5.7 - 6.4          Diabetes: >6.4          Glycemic control for adults with diabetes: <7.0    Est. average glucose Bld gHb Est-mCnc 240 mg/dL  PSA     Status: None   Collection Time: 11/13/23 10:11 AM  Result Value Ref Range   Prostate Specific Ag, Serum 1.6 0.0 - 4.0 ng/mL    Comment: Roche ECLIA methodology. According to the American Urological Association, Serum PSA should decrease and remain at undetectable levels after radical prostatectomy. The AUA defines biochemical recurrence as an initial PSA value 0.2 ng/mL or greater followed by a subsequent confirmatory PSA value 0.2 ng/mL or greater. Values obtained with different assay methods or kits cannot be used interchangeably. Results cannot be interpreted as absolute evidence of the presence or absence of malignant disease.   POCT CBG (Fasting - Glucose)     Status: Abnormal   Collection Time: 11/27/23 10:54 AM  Result Value Ref Range   Glucose Fasting, POC 200 (A) 70 - 99 mg/dL      Assessment & Plan:  Strict  diet control emphasized.  Increase insulin  7030 to 40 units twice a day.  Monitor blood sugars at home. Continue Farxiga  and metformin . Problem List Items Addressed This Visit     Diabetes mellitus (HCC)   Relevant Medications   insulin  NPH-regular Human (70-30) 100 UNIT/ML injection   dapagliflozin  propanediol (FARXIGA ) 10 MG TABS tablet   Mixed hyperlipidemia   Combined hyperlipidemia associated with type 2 diabetes mellitus (HCC)   Relevant Medications   insulin  NPH-regular Human (70-30) 100 UNIT/ML injection   dapagliflozin  propanediol (FARXIGA ) 10 MG TABS tablet   Hypertension associated with diabetes (HCC) - Primary   Relevant Medications   insulin  NPH-regular Human (70-30) 100 UNIT/ML injection   dapagliflozin  propanediol (FARXIGA ) 10 MG TABS tablet    Return in about 3 weeks (around 12/18/2023).   Total time spent: 30 minutes  Aisha Hove, MD  11/27/2023   This document may have been prepared by [facility] Voice Recognition software and as such may include unintentional dictation errors.

## 2023-12-03 ENCOUNTER — Ambulatory Visit (INDEPENDENT_AMBULATORY_CARE_PROVIDER_SITE_OTHER): Payer: Self-pay | Admitting: Urology

## 2023-12-03 ENCOUNTER — Encounter: Payer: Self-pay | Admitting: Urology

## 2023-12-03 VITALS — BP 134/79 | Ht 68.0 in | Wt 190.0 lb

## 2023-12-03 DIAGNOSIS — N486 Induration penis plastica: Secondary | ICD-10-CM

## 2023-12-03 DIAGNOSIS — Z125 Encounter for screening for malignant neoplasm of prostate: Secondary | ICD-10-CM

## 2023-12-03 DIAGNOSIS — N529 Male erectile dysfunction, unspecified: Secondary | ICD-10-CM

## 2023-12-03 MED ORDER — TADALAFIL 5 MG PO TABS: 5.0000 mg | ORAL_TABLET | Freq: Every day | ORAL | 11 refills | Status: AC | PRN

## 2023-12-03 NOTE — Patient Instructions (Signed)
 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  This is very challenging to fix.  I recommend Cialis to help with the erections  There are injections called Xiaflex that can help with curvature, but on average these only improve the curvature approximately 30%, and can be painful and very expensive.  I would not recommend those for you at this time.  The other option would be surgical correction to straighten the penis, but with your diabetes this would not be a good option and your diabetes would have to be better controlled prior to considering that surgery.   If things worsen, please reach out to our clinic.

## 2023-12-03 NOTE — Progress Notes (Signed)
   12/03/23 1:16 PM   Russell Greene 1960-11-13 [id number]  CC: Penile curvature, PSA screening  HPI: 63 year old male with poorly controlled diabetes(hemoglobin A1c 10) who presents with at least 1 year of penile curvature.  He reports the curvature is approximately 30 degrees to the left, this does not prevent sexual activity or cause pain for him or his partner.  He has some mild problems with erections where he feels these are not as long as previously and he has lost some length.  He denies any pain with erections anymore.  The curvature has been stable.  PSA has been normal, most recently 1.6 from May 2025.   PMH: Past Medical History:  Diagnosis Date   Diabetes mellitus without complication (HCC)       Family History: Family History  Problem Relation Age of Onset   Parkinson's disease Father     Social History:  reports that he has been smoking cigarettes. He started smoking about 24 years ago. He has a 5 pack-year smoking history. He has never used smokeless tobacco. He reports that he does not currently use alcohol. He reports that he does not use drugs.  Physical Exam: BP 134/79   Ht 5\' 8"  (1.727 m)   Wt 190 lb (86.2 kg)   BMI 28.89 kg/m    Constitutional:  Alert and oriented, No acute distress. Cardiovascular: No clubbing, cyanosis, or edema. Respiratory: Normal respiratory effort, no increased work of breathing. GI: Abdomen is soft, nontender, nondistended, no abdominal masses GU: Uncircumcised phallus with patent meatus, easily palpable nontender 1 cm penile plaque at the left dorsal base of the penis, no other suspicious lesions   Assessment & Plan:   63 year old male with poorly controlled diabetes who presents with peyronies disease, mild ED, and with normal PSA.  We reviewed the AUA guidelines regarding peyronies disease, and treatment options including observation, treatment of his mild ED, Xiaflex injections, or surgical options.  As he is minimally  bothered by the curvature, as well as uninsured, I did not recommend Xiaflex injections at this time as I think this would be a fairly significant cost to the patient with only minimal potential benefit.  Would not be a good candidate for penile plication with his poorly controlled diabetes, and again also with his minimal bother from symptoms.  I recommended starting with a trial of Cialis 5 mg as needed prior to sexual activity to help with erections and potentially length concerns.  Reassurance provided that peyronies disease is a noncancerous benign condition, return precautions were discussed, and he prefers to follow-up as needed  Cialis 5 to 20 mg on demand for ED Defers any intervention for peyronies disease at this time Follow-up with urology as needed  Jay Meth, MD 12/03/2023  [facility] [address] [place], [place] [address] [phone]

## 2023-12-18 ENCOUNTER — Ambulatory Visit: Payer: Self-pay | Admitting: Internal Medicine

## 2023-12-23 ENCOUNTER — Ambulatory Visit: Payer: Self-pay | Admitting: Internal Medicine

## 2024-01-06 ENCOUNTER — Ambulatory Visit: Payer: Self-pay | Admitting: Internal Medicine

## 2024-08-12 ENCOUNTER — Ambulatory Visit: Payer: Self-pay | Admitting: Internal Medicine
